# Patient Record
Sex: Female | Born: 1990 | Hispanic: Yes | Marital: Single | Smoking: Never smoker
Health system: Southern US, Community
[De-identification: ages and names within clinical notes are randomized; demographics above are authoritative.]

## PROBLEM LIST (undated history)

## (undated) ENCOUNTER — Inpatient Hospital Stay (HOSPITAL_COMMUNITY): Payer: Self-pay

## (undated) HISTORY — PX: ABSCESS DRAINAGE: SHX1119

---

## 2009-10-30 ENCOUNTER — Emergency Department: Payer: Self-pay

## 2013-11-13 ENCOUNTER — Emergency Department (HOSPITAL_COMMUNITY)
Admission: EM | Admit: 2013-11-13 | Discharge: 2013-11-13 | Disposition: A | Discharge status: Home / Self Care | Payer: Medicaid Other | Attending: Emergency Medicine | Admitting: Emergency Medicine

## 2013-11-13 ENCOUNTER — Emergency Department (HOSPITAL_COMMUNITY): Payer: Medicaid Other

## 2013-11-13 ENCOUNTER — Encounter (HOSPITAL_COMMUNITY): Payer: Self-pay | Admitting: Emergency Medicine

## 2013-11-13 DIAGNOSIS — N939 Abnormal uterine and vaginal bleeding, unspecified: Secondary | ICD-10-CM

## 2013-11-13 DIAGNOSIS — O210 Mild hyperemesis gravidarum: Secondary | ICD-10-CM | POA: Insufficient documentation

## 2013-11-13 DIAGNOSIS — O200 Threatened abortion: Secondary | ICD-10-CM | POA: Insufficient documentation

## 2013-11-13 LAB — CBC
HCT: 40.1 % (ref 36.0–46.0)
Hemoglobin: 14.3 g/dL (ref 12.0–15.0)
MCH: 31.5 pg (ref 26.0–34.0)
MCHC: 35.7 g/dL (ref 30.0–36.0)
MCV: 88.3 fL (ref 78.0–100.0)
PLATELETS: 256 10*3/uL (ref 150–400)
RBC: 4.54 MIL/uL (ref 3.87–5.11)
RDW: 12.4 % (ref 11.5–15.5)
WBC: 11.4 10*3/uL — ABNORMAL HIGH (ref 4.0–10.5)

## 2013-11-13 LAB — WET PREP, GENITAL
Clue Cells Wet Prep HPF POC: NONE SEEN
Trich, Wet Prep: NONE SEEN
Yeast Wet Prep HPF POC: NONE SEEN

## 2013-11-13 LAB — PREGNANCY, URINE: PREG TEST UR: POSITIVE — AB

## 2013-11-13 LAB — ABO/RH: ABO/RH(D): O POS

## 2013-11-13 LAB — HCG, QUANTITATIVE, PREGNANCY: hCG, Beta Chain, Quant, S: 36046 m[IU]/mL — ABNORMAL HIGH (ref <5)

## 2013-11-13 NOTE — ED Notes (Signed)
PA assess for fetal heart tones- unable to hear any at this time. Pt states that at her most recent appointment they were unable to hear any heart tones as well.

## 2013-11-13 NOTE — ED Notes (Signed)
Pt knows to [facility] in the next couple of days. Pt is stable for discharge.

## 2013-11-13 NOTE — ED Provider Notes (Signed)
CSN: [id number]     Arrival date & time 11/13/13  1149 History   First MD Initiated Contact with Patient 11/13/13 1257     Chief Complaint  Patient presents with  . Vaginal Bleeding     (Consider location/radiation/quality/duration/timing/severity/associated sxs/prior Treatment) Patient is a 23 y.o. female presenting with vaginal bleeding.  Vaginal Bleeding  23 yo G2P1 female presents with vaginal bleeding and [redacted] weeks pregnant. Patient states she first noticed bleeding Saturday evening around 7pm. Patient reports bright red blood per vagina, the equivalent of a "light period" for her. Patient states she bled through her underwear and shorts. Reports going to restroom and feeling/hearing something fall from her vagina into the toilet but did not see anything upon inspection. Patient also admits to mild lower abdominal cramping that started today rated 1/10 and constant. States bleeding has diminished and reports no current bleeding as of today. Denies tobacco or alcohol use. Denies any medication use. Patient admits to N/V consistent with her typcial morning sickness. Denies any Diarrhea or bloody stools. Patient denies any other concerns at this time. Patient states she goes to health dept for prenatal care.  History reviewed. No pertinent past medical history. History reviewed. No pertinent past surgical history. No family history on file. History  Substance Use Topics  . Smoking status: Never Smoker   . Smokeless tobacco: Not on file  . Alcohol Use: No   OB History   Grav Para Term Preterm Abortions TAB SAB Ect Mult Living   1              Review of Systems  Genitourinary: Positive for vaginal bleeding.  All other systems reviewed and are negative.     Allergies  Review of patient's allergies indicates no known allergies.  Home Medications   Prior to Admission medications   Not on File   BP 89/54  Pulse 80  Temp(Src) 97.9 F (36.6 C) (Oral)  Resp 16  Wt 155 lb  (70.308 kg)  SpO2 99% Physical Exam  Nursing note and vitals reviewed. Constitutional: She is oriented to person, place, and time. She appears well-developed and well-nourished. No distress.  HENT:  Head: Normocephalic and atraumatic.  Neck: No JVD present. No tracheal deviation present.  Cardiovascular: Normal rate and regular rhythm.  Exam reveals no gallop and no friction rub.   No murmur heard. Pulmonary/Chest: Effort normal. No respiratory distress. She has no wheezes. She has no rhonchi. She has no rales.  Musculoskeletal: She exhibits no edema.  Neurological: She is alert and oriented to person, place, and time.  Skin: Skin is warm and dry. She is not diaphoretic.  Psychiatric: She has a normal mood and affect. Her behavior is normal.    ED Course  Procedures (including critical care time) Labs Review Labs Reviewed  WET PREP, GENITAL - Abnormal; Notable for the following:    WBC, Wet Prep HPF POC TOO NUMEROUS TO COUNT (*)    All other components within normal limits  PREGNANCY, URINE - Abnormal; Notable for the following:    Preg Test, Ur POSITIVE (*)    All other components within normal limits  CBC - Abnormal; Notable for the following:    WBC 11.4 (*)    All other components within normal limits  HCG, QUANTITATIVE, PREGNANCY - Abnormal; Notable for the following:    hCG, Beta Chain, Quant, S 08657 (*)    All other components within normal limits  GC/CHLAMYDIA PROBE AMP  URINALYSIS, ROUTINE W REFLEX  MICROSCOPIC  ABO/RH    Imaging Review [place] Ob Comp Less 14 Wks  11/13/2013   CLINICAL DATA:  Vaginal bleeding.  First trimester pregnancy.  EXAM: OBSTETRIC <14 WK US AND TRANSVAGINAL OB US  TECHNIQUE: Both transabdominal and transvaginal ultrasound examinations were performed for complete evaluation of the gestation as well as the maternal uterus, adnexal regions, and pelvic cul-de-sac. Transvaginal technique was performed to assess early pregnancy.  COMPARISON:  None.   FINDINGS: Intrauterine gestational sac: Visualized/normal in shape. Unfused amnion noted.  Yolk sac:  Visualized.  Embryo:  Visualized.  Cardiac Activity: Visualized.  Heart Rate:  197 bpm  CRL:   23.7  mm   9 w 1 d                  US EDC: 06/17/2014  Maternal uterus/adnexae: There is no evidence of subchorionic hematoma. Both maternal ovaries appear normal. There is no adnexal mass or free pelvic fluid.  IMPRESSION: Single live intrauterine pregnancy with best estimated gestational age of [redacted] weeks 1 day. No placental or adnexal abnormalities identified.   Electronically Signed   By: Roxy HorsemanBill  Veazey M.D.   On: 11/13/2013 15:36   [place] Ob Transvaginal  11/13/2013   CLINICAL DATA:  Vaginal bleeding.  First trimester pregnancy.  EXAM: OBSTETRIC <14 WK US AND TRANSVAGINAL OB US  TECHNIQUE: Both transabdominal and transvaginal ultrasound examinations were performed for complete evaluation of the gestation as well as the maternal uterus, adnexal regions, and pelvic cul-de-sac. Transvaginal technique was performed to assess early pregnancy.  COMPARISON:  None.  FINDINGS: Intrauterine gestational sac: Visualized/normal in shape. Unfused amnion noted.  Yolk sac:  Visualized.  Embryo:  Visualized.  Cardiac Activity: Visualized.  Heart Rate:  197 bpm  CRL:   23.7  mm   9 w 1 d                  US EDC: 06/17/2014  Maternal uterus/adnexae: There is no evidence of subchorionic hematoma. Both maternal ovaries appear normal. There is no adnexal mass or free pelvic fluid.  IMPRESSION: Single live intrauterine pregnancy with best estimated gestational age of [redacted] weeks 1 day. No placental or adnexal abnormalities identified.   Electronically Signed   By: Roxy HorsemanBill  Veazey M.D.   On: 11/13/2013 15:36     EKG Interpretation None      MDM   Final diagnoses:  Threatened abortion  Vaginal bleeding    Patient afebrile with normal VS on presentation.  Appears in NAD.  Patient with positive pregnancy  Benign abdominal exam, soft and  non tender.  Wet prep negative for yeast, trich, and clue cells. Too many WBCs to count on wet prep.  H& H appears stable. Pelvic exam reveals no active bleeding.  Blood type O+, no need for Rhogam at this time.  Mild leukocytosis, nonspecific, suspect secondary to pregnancy.  Quant HCG elevated to [address] Plan to get pelvic US. To assess for viable IUP.   Ultrasound shows single live IUP with EGA of [redacted] wks and 1 day, with no placental or adnexal abnormalities.    Discussed findings with patient.Explained risks of threatened abortion. Patient advised to abstain for lifting, heavy exertion, or sexual intercourse until able to follow up with OBGYN for further evaluation.   . Plan to have patient follow up with her OBGYN. [facility] for [facility] follow up if needed.   Return precautions given for any worsening sxs and advised to return immediately if  any worsening abdominal pain or vaginal bleeding.  Patient confirms understanding.  Discharged in good condition.       Rudene AndaJacob Gray Kealii Thueson, PA-C 11/14/13 1601

## 2013-11-13 NOTE — Discharge Instructions (Signed)
Follow up with [facility] in 2 days for further management of your pregnancy and vaginal bleeding. Return to Emergency department if you develop any worsening vaginal bleeding or abdominal pain.    Threatened Miscarriage  A threatened miscarriage is a pregnancy that may end. It may be marked by bleeding during the first 20 weeks of pregnancy. Often, the pregnancy can continue without any more problems. You may be asked to stop:  Having sex (intercourse).  Having orgasms.  Using tampons.  Exercising.  Doing heavy physical activity and work. HOME CARE   Your doctor may tell you to take bed rest and to stop activities and work.  Write down the number of pads you use each day. Write down how often you change pads. Write down how soaked they are.  Follow your doctor's advice for follow-up visits and tests.  If your blood type is Rh-negative and the father's blood is Rh-positive (or is not known), you may get a shot to protect the baby.  If you have a miscarriage, save all the tissue you pass in a container. Take the container to your doctor. GET HELP RIGHT AWAY IF:   You have bad cramps or pain in your belly (abdomen), lower belly, or back.  You have a fever or chills.  Your bleeding gets worse or you pass large clots of blood or tissue. Save this tissue to show your doctor.  You feel lightheaded, weak, dizzy, or pass out (faint).  You have a gush of fluid from your vagina. MAKE SURE YOU:   Understand these instructions.  Will watch your condition.  Will get help right away if you are not doing well or get worse. Document Released: 06/25/2008 Document Revised: 10/05/2011 Document Reviewed: 07/29/2009 [facility] Patient Information 2014 [place], [place].

## 2013-11-13 NOTE — ED Notes (Addendum)
Pt reports [redacted] weeks pregnant, small amount vaginal bleeding since Saturday. Went to health department this morning, sent here for ultrasound. Pt is a x 4. In NAD. G2 P1.

## 2013-11-14 LAB — GC/CHLAMYDIA PROBE AMP
CT Probe RNA: NEGATIVE
GC Probe RNA: NEGATIVE

## 2013-11-17 NOTE — ED Provider Notes (Signed)
Medical screening examination/treatment/procedure(s) were performed by non-physician practitioner and as supervising physician I was immediately available for consultation/collaboration.   EKG Interpretation None       Brexley Cutshaw T Archibald Marchetta, MD 11/17/13 1420 

## 2014-05-28 ENCOUNTER — Encounter (HOSPITAL_COMMUNITY): Payer: Self-pay | Admitting: Emergency Medicine

## 2015-09-03 ENCOUNTER — Inpatient Hospital Stay (HOSPITAL_COMMUNITY): Payer: Medicaid Other

## 2015-09-03 ENCOUNTER — Encounter (HOSPITAL_COMMUNITY): Payer: Self-pay | Admitting: *Deleted

## 2015-09-03 ENCOUNTER — Inpatient Hospital Stay (HOSPITAL_COMMUNITY)
Admission: AD | Admit: 2015-09-03 | Discharge: 2015-09-03 | Disposition: A | Discharge status: Home / Self Care | Payer: Medicaid Other | Source: Ambulatory Visit | Attending: Family Medicine | Admitting: Family Medicine

## 2015-09-03 DIAGNOSIS — R109 Unspecified abdominal pain: Secondary | ICD-10-CM | POA: Insufficient documentation

## 2015-09-03 DIAGNOSIS — O26899 Other specified pregnancy related conditions, unspecified trimester: Secondary | ICD-10-CM

## 2015-09-03 DIAGNOSIS — O209 Hemorrhage in early pregnancy, unspecified: Secondary | ICD-10-CM | POA: Diagnosis not present

## 2015-09-03 DIAGNOSIS — O4691 Antepartum hemorrhage, unspecified, first trimester: Secondary | ICD-10-CM | POA: Insufficient documentation

## 2015-09-03 DIAGNOSIS — O9989 Other specified diseases and conditions complicating pregnancy, childbirth and the puerperium: Secondary | ICD-10-CM | POA: Diagnosis not present

## 2015-09-03 DIAGNOSIS — Z3A11 11 weeks gestation of pregnancy: Secondary | ICD-10-CM | POA: Diagnosis not present

## 2015-09-03 LAB — URINALYSIS, ROUTINE W REFLEX MICROSCOPIC
Bilirubin Urine: NEGATIVE
Glucose, UA: NEGATIVE mg/dL
Ketones, ur: NEGATIVE mg/dL
Nitrite: NEGATIVE
Protein, ur: NEGATIVE mg/dL
Specific Gravity, Urine: 1.025 (ref 1.005–1.030)
pH: 5.5 (ref 5.0–8.0)

## 2015-09-03 LAB — CBC
HEMATOCRIT: 38 % (ref 36.0–46.0)
Hemoglobin: 13.6 g/dL (ref 12.0–15.0)
MCH: 31.1 pg (ref 26.0–34.0)
MCHC: 35.8 g/dL (ref 30.0–36.0)
MCV: 87 fL (ref 78.0–100.0)
Platelets: 231 10*3/uL (ref 150–400)
RBC: 4.37 MIL/uL (ref 3.87–5.11)
RDW: 13.5 % (ref 11.5–15.5)
WBC: 11.3 10*3/uL — ABNORMAL HIGH (ref 4.0–10.5)

## 2015-09-03 LAB — URINE MICROSCOPIC-ADD ON

## 2015-09-03 LAB — WET PREP, GENITAL
Clue Cells Wet Prep HPF POC: NONE SEEN
Sperm: NONE SEEN
Trich, Wet Prep: NONE SEEN
YEAST WET PREP: NONE SEEN

## 2015-09-03 LAB — POCT PREGNANCY, URINE: Preg Test, Ur: POSITIVE — AB

## 2015-09-03 LAB — ABO/RH: ABO/RH(D): O POS

## 2015-09-03 LAB — HCG, QUANTITATIVE, PREGNANCY: hCG, Beta Chain, Quant, S: 42043 m[IU]/mL — ABNORMAL HIGH (ref <5)

## 2015-09-03 NOTE — MAU Note (Signed)
Has been cramping and bleeding since last night.  Like a light period, small clots.  Ongoing nausea and vomiting

## 2015-09-03 NOTE — MAU Provider Note (Signed)
Chief Complaint: Abdominal Cramping and Vaginal Bleeding   First Provider Initiated Contact with Patient 09/03/15 1417        SUBJECTIVE  HPI Suzanne Shelton is a 25 y.o. G3P0 at [redacted]w[redacted]d by LMP who presents to maternity admissions reporting bleeding and cramping since yesterday. States it is like a period, but cramping is light. No focal pain on the sides.. She denies vaginal itching/burning, urinary symptoms, h/a, dizziness, n/v, or fever/chills.    History is remarkable for placenta previa with first baby.   RN Note: Has been cramping and bleeding since last night. Like a light period, small clots. Ongoing nausea and vomiting         History reviewed. No pertinent past medical history. History reviewed. No pertinent past surgical history. Social History   Social History  . Marital Status: Single    Spouse Name: N/A  . Number of Children: N/A  . Years of Education: N/A   Occupational History  . Not on file.   Social History Main Topics  . Smoking status: Never Smoker   . Smokeless tobacco: Not on file  . Alcohol Use: No  . Drug Use: No  . Sexual Activity: Yes    Birth Control/ Protection: None   Other Topics Concern  . Not on file   Social History Narrative   No current facility-administered medications on file prior to encounter.   Current Outpatient Prescriptions on File Prior to Encounter  Medication Sig Dispense Refill  . Prenatal Vit-Fe Sulfate-FA (PRENATAL VITAMIN PO) Take 1 tablet by mouth daily.     No Known Allergies  I have reviewed patient's Past Medical Hx, Surgical Hx, Family Hx, Social Hx, medications and allergies.   ROS:  Review of Systems  Constitutional: Negative for fever and chills.  Gastrointestinal: Negative for nausea, vomiting, abdominal pain, diarrhea and constipation.  Genitourinary: Negative for dysuria. Positive for bleeding and cramping Musculoskeletal: Negative for back pain.  Neurological: Negative for dizziness  and weakness.    Physical Exam  Patient Vitals for the past 24 hrs:  BP Temp Temp src Pulse Resp SpO2 Height Weight  09/03/15 1335 106/66 mmHg 97.4 F (36.3 C) Oral 98 18 100 % 5' 3.5" (1.613 m) 140 lb 9.6 oz (63.776 kg)    Physical Exam  Constitutional: Well-developed, well-nourished female in no acute distress.  Cardiovascular: normal rate Respiratory: normal effort GI: Abd soft, non-tender. Pos BS x 4 MS: Extremities nontender, no edema, normal ROM Neurologic: Alert and oriented x 4.  GU: Neg CVAT.  PELVIC EXAM: Cervix pink, visually closed, without lesion, small amount light red discharge, vaginal walls and external genitalia normal Bimanual exam: Cervix 0/long/high, firm, anterior, neg CMT, uterus nontender, about 10 wk size,     adnexa without tenderness, or mass   LAB RESULTS Results for orders placed or performed during the hospital encounter of 09/03/15 (from the past 24 hour(s))  Urinalysis, Routine w reflex microscopic (not at [facility])     Status: Abnormal   Collection Time: 09/03/15  1:40 PM  Result Value Ref Range   Color, Urine YELLOW YELLOW   APPearance CLEAR CLEAR   Specific Gravity, Urine 1.025 1.005 - 1.030   pH 5.5 5.0 - 8.0   Glucose, UA NEGATIVE NEGATIVE mg/dL   Hgb urine dipstick LARGE (A) NEGATIVE   Bilirubin Urine NEGATIVE NEGATIVE   Ketones, ur NEGATIVE NEGATIVE mg/dL   Protein, ur NEGATIVE NEGATIVE mg/dL   Nitrite NEGATIVE NEGATIVE   Leukocytes, UA TRACE (A) NEGATIVE  Urine microscopic-add on     Status: Abnormal   Collection Time: 09/03/15  1:40 PM  Result Value Ref Range   Squamous Epithelial / LPF 6-30 (A) NONE SEEN   WBC, UA 0-5 0 - 5 WBC/hpf   RBC / HPF 6-30 0 - 5 RBC/hpf   Bacteria, UA MANY (A) NONE SEEN   Urine-Other MUCOUS PRESENT   Pregnancy, urine POC     Status: Abnormal   Collection Time: 09/03/15  1:50 PM  Result Value Ref Range   Preg Test, Ur POSITIVE (A) NEGATIVE  CBC     Status: Abnormal   Collection Time: 09/03/15  2:30 PM   Result Value Ref Range   WBC 11.3 (H) 4.0 - 10.5 K/uL   RBC 4.37 3.87 - 5.11 MIL/uL   Hemoglobin 13.6 12.0 - 15.0 g/dL   HCT 16.1 09.6 - 04.5 %   MCV 87.0 78.0 - 100.0 fL   MCH 31.1 26.0 - 34.0 pg   MCHC 35.8 30.0 - 36.0 g/dL   RDW 40.9 81.1 - 91.4 %   Platelets 231 150 - 400 K/uL  hCG, quantitative, pregnancy     Status: Abnormal   Collection Time: 09/03/15  2:30 PM  Result Value Ref Range   hCG, Beta Chain, Quant, S 42043 (H) <5 mIU/mL  ABO/Rh     Status: None   Collection Time: 09/03/15  2:30 PM  Result Value Ref Range   ABO/RH(D) O POS   Wet prep, genital     Status: Abnormal   Collection Time: 09/03/15  2:30 PM  Result Value Ref Range   Yeast Wet Prep HPF POC NONE SEEN NONE SEEN   Trich, Wet Prep NONE SEEN NONE SEEN   Clue Cells Wet Prep HPF POC NONE SEEN NONE SEEN   WBC, Wet Prep HPF POC MANY (A) NONE SEEN   Sperm NONE SEEN        IMAGING US Ob Comp Less 14 Wks  09/03/2015  CLINICAL DATA:  Pelvic pain and cramping. Vaginal bleeding. Gestational age by LMP of 10 weeks 5 days. EXAM: OBSTETRIC <14 WK ULTRASOUND TECHNIQUE: Transabdominal ultrasound was performed for evaluation of the gestation as well as the maternal uterus and adnexal regions. COMPARISON:  None. FINDINGS: Intrauterine gestational sac: Visualized/normal in shape. Yolk sac:  Visualized Embryo:  Visualized Cardiac Activity: Visualized Heart Rate: 164 bpm CRL:   51  mm   11 w 6 d                  [place] EDC: 03/18/2016 Subchorionic hemorrhage: Small subchorionic hemorrhage seen in lower uterine segment. Maternal uterus/adnexae: Both ovaries are normal in appearance. No adnexal mass or free fluid identified. IMPRESSION: Single living IUP measuring 11 weeks 6 days with [place] EDC of 03/18/2016. Small subchorionic hemorrhage noted. Electronically Signed   By: Myles Rosenthal M.D.   On: 09/03/2015 15:33    MAU Management/MDM: Ordered labs and reviewed results.     Treatments in MAU included none.   This bleeding could have  represented a normal pregnancy with bleeding, spontaneous abortion or even an ectopic which can be life-threatening.   Cultures were done to rule out pelvic infection Blood drawn for Quant HCG, CBC, ABO/Rh  Pt stable at time of discharge.  ASSESSMENT SIUP at [redacted]w[redacted]d, live IUP Bleeding in early pregnancy Abdominal pain in pregnancy Small subchorionic hemorrhage  PLAN Discharge home Discussed results and [facility] Bleeding precautions Encouraged to seek early prenatal care    Medication List  ASK your doctor about these medications        PRENATAL VITAMIN PO  Take 1 tablet by mouth daily.         Wynelle Bourgeois CNM, MSN Certified Nurse-Midwife 09/03/2015  2:17 PM

## 2015-09-03 NOTE — Discharge Instructions (Signed)
Vaginal Bleeding During Pregnancy, First Trimester A small amount of bleeding (spotting) from the vagina is relatively common in early pregnancy. It usually stops on its own. Various things may cause bleeding or spotting in early pregnancy. Some bleeding may be related to the pregnancy, and some may not. In most cases, the bleeding is normal and is not a problem. However, bleeding can also be a sign of something serious. Be sure to tell your health care provider about any vaginal bleeding right away. Some possible causes of vaginal bleeding during the first trimester include:  Infection or inflammation of the cervix.  Growths (polyps) on the cervix.  Miscarriage or threatened miscarriage.  Pregnancy tissue has developed outside of the uterus and in a fallopian tube (tubal pregnancy).  Tiny cysts have developed in the uterus instead of pregnancy tissue (molar pregnancy). HOME CARE INSTRUCTIONS  Watch your condition for any changes. The following actions may help to lessen any discomfort you are feeling:  Follow your health care provider's instructions for limiting your activity. If your health care provider orders bed rest, you may need to stay in bed and only get up to use the bathroom. However, your health care provider may allow you to continue light activity.  If needed, make plans for someone to help with your regular activities and responsibilities while you are on bed rest.  Keep track of the number of pads you use each day, how often you change pads, and how soaked (saturated) they are. Write this down.  Do not use tampons. Do not douche.  Do not have sexual intercourse or orgasms until approved by your health care provider.  If you pass any tissue from your vagina, save the tissue so you can show it to your health care provider.  Only take over-the-counter or prescription medicines as directed by your health care provider.  Do not take aspirin because it can make you  bleed.  Keep all follow-up appointments as directed by your health care provider. SEEK MEDICAL CARE IF:  You have any vaginal bleeding during any part of your pregnancy.  You have cramps or labor pains.  You have a fever, not controlled by medicine. SEEK IMMEDIATE MEDICAL CARE IF:   You have severe cramps in your back or belly (abdomen).  You pass large clots or tissue from your vagina.  Your bleeding increases.  You feel light-headed or weak, or you have fainting episodes.  You have chills.  You are leaking fluid or have a gush of fluid from your vagina.  You pass out while having a bowel movement. MAKE SURE YOU:  Understand these instructions.  Will watch your condition.  Will get help right away if you are not doing well or get worse.   This information is not intended to replace advice given to you by your health care provider. Make sure you discuss any questions you have with your health care provider.   Document Released: 04/22/2005 Document Revised: 07/18/2013 Document Reviewed: 03/20/2013 Elsevier Interactive Patient Education [employer] First Trimester of Pregnancy The first trimester of pregnancy is from week 1 until the end of week 12 (months 1 through 3). A week after a sperm fertilizes an egg, the egg will implant on the wall of the uterus. This embryo will begin to develop into a baby. Genes from you and your partner are forming the baby. The female genes determine whether the baby is a boy or a girl. At 6-8 weeks, the eyes and face are  face are formed, and the heartbeat can be seen on ultrasound. At the end of 12 weeks, all the baby's organs are formed.  °Now that you are pregnant, you will want to do everything you can to have a healthy baby. Two of the most important things are to get good prenatal care and to follow your health care provider's instructions. Prenatal care is all the medical care you receive before the baby's birth. This care will help prevent,  find, and treat any problems during the pregnancy and childbirth. °BODY CHANGES °Your body goes through many changes during pregnancy. The changes vary from woman to woman.  °· You may gain or lose a couple of pounds at first. °· You may feel sick to your stomach (nauseous) and throw up (vomit). If the vomiting is uncontrollable, call your health care provider. °· You may tire easily. °· You may develop headaches that can be relieved by medicines approved by your health care provider. °· You may urinate more often. Painful urination may mean you have a bladder infection. °· You may develop heartburn as a result of your pregnancy. °· You may develop constipation because certain hormones are causing the muscles that push waste through your intestines to slow down. °· You may develop hemorrhoids or swollen, bulging veins (varicose veins). °· Your breasts may begin to grow larger and become tender. Your nipples may stick out more, and the tissue that surrounds them (areola) may become darker. °· Your gums may bleed and may be sensitive to brushing and flossing. °· Dark spots or blotches (chloasma, mask of pregnancy) may develop on your face. This will likely fade after the baby is born. °· Your menstrual periods will stop. °· You may have a loss of appetite. °· You may develop cravings for certain kinds of food. °· You may have changes in your emotions from day to day, such as being excited to be pregnant or being concerned that something may go wrong with the pregnancy and baby. °· You may have more vivid and strange dreams. °· You may have changes in your hair. These can include thickening of your hair, rapid growth, and changes in texture. Some women also have hair loss during or after pregnancy, or hair that feels dry or thin. Your hair will most likely return to normal after your baby is born. °WHAT TO EXPECT AT YOUR PRENATAL VISITS °During a routine prenatal visit: °· You will be weighed to make sure you and the  baby are growing normally. °· Your blood pressure will be taken. °· Your abdomen will be measured to track your baby's growth. °· The fetal heartbeat will be listened to starting around week 10 or 12 of your pregnancy. °· Test results from any previous visits will be discussed. °Your health care provider may ask you: °· How you are feeling. °· If you are feeling the baby move. °· If you have had any abnormal symptoms, such as leaking fluid, bleeding, severe headaches, or abdominal cramping. °· If you are using any tobacco products, including cigarettes, chewing tobacco, and electronic cigarettes. °· If you have any questions. °Other tests that may be performed during your first trimester include: °· Blood tests to find your blood type and to check for the presence of any previous infections. They will also be used to check for low iron levels (anemia) and Rh antibodies. Later in the pregnancy, blood tests for diabetes will be done along with other tests if problems develop. °· Urine tests   for infections, diabetes, or protein in the urine.  An ultrasound to confirm the proper growth and development of the baby.  An amniocentesis to check for possible genetic problems.  Fetal screens for spina bifida and Down syndrome.  You may need other tests to make sure you and the baby are doing well.  HIV (human immunodeficiency virus) testing. Routine prenatal testing includes screening for HIV, unless you choose not to have this test. HOME CARE INSTRUCTIONS  Medicines  Follow your health care provider's instructions regarding medicine use. Specific medicines may be either safe or unsafe to take during pregnancy.  Take your prenatal vitamins as directed.  If you develop constipation, try taking a stool softener if your health care provider approves. Diet  Eat regular, well-balanced meals. Choose a variety of foods, such as meat or vegetable-based protein, fish, milk and low-fat dairy products,  vegetables, fruits, and whole grain breads and cereals. Your health care provider will help you determine the amount of weight gain that is right for you.  Avoid raw meat and uncooked cheese. These carry germs that can cause birth defects in the baby.  Eating four or five small meals rather than three large meals a day may help relieve nausea and vomiting. If you start to feel nauseous, eating a few soda crackers can be helpful. Drinking liquids between meals instead of during meals also seems to help nausea and vomiting.  If you develop constipation, eat more high-fiber foods, such as fresh vegetables or fruit and whole grains. Drink enough fluids to keep your urine clear or pale yellow. Activity and Exercise  Exercise only as directed by your health care provider. Exercising will help you:  Control your weight.  Stay in shape.  Be prepared for labor and delivery.  Experiencing pain or cramping in the lower abdomen or low back is a good sign that you should stop exercising. Check with your health care provider before continuing normal exercises.  Try to avoid standing for long periods of time. Move your legs often if you must stand in one place for a long time.  Avoid heavy lifting.  Wear low-heeled shoes, and practice good posture.  You may continue to have sex unless your health care provider directs you otherwise. Relief of Pain or Discomfort  Wear a good support bra for breast tenderness.   Take warm sitz baths to soothe any pain or discomfort caused by hemorrhoids. Use hemorrhoid cream if your health care provider approves.   Rest with your legs elevated if you have leg cramps or low back pain.  If you develop varicose veins in your legs, wear support hose. Elevate your feet for 15 minutes, 3-4 times a day. Limit salt in your diet. Prenatal Care  Schedule your prenatal visits by the twelfth week of pregnancy. They are usually scheduled monthly at first, then more often in  the last 2 months before delivery.  Write down your questions. Take them to your prenatal visits.  Keep all your prenatal visits as directed by your health care provider. Safety  Wear your seat belt at all times when driving.  Make a list of emergency phone numbers, including numbers for family, friends, the hospital, and police and fire departments. General Tips  Ask your health care provider for a referral to a local prenatal education class. Begin classes no later than at the beginning of month 6 of your pregnancy.  Ask for help if you have counseling or nutritional needs during pregnancy. Your health  care provider can offer advice or refer you to specialists for help with various needs.  Do not use hot tubs, steam rooms, or saunas.  Do not douche or use tampons or scented sanitary pads.  Do not cross your legs for long periods of time.  Avoid cat litter boxes and soil used by cats. These carry germs that can cause birth defects in the baby and possibly loss of the fetus by miscarriage or stillbirth.  Avoid all smoking, herbs, alcohol, and medicines not prescribed by your health care provider. Chemicals in these affect the formation and growth of the baby.  Do not use any tobacco products, including cigarettes, chewing tobacco, and electronic cigarettes. If you need help quitting, ask your health care provider. You may receive counseling support and other resources to help you quit.  Schedule a dentist appointment. At home, brush your teeth with a soft toothbrush and be gentle when you floss. SEEK MEDICAL CARE IF:   You have dizziness.  You have mild pelvic cramps, pelvic pressure, or nagging pain in the abdominal area.  You have persistent nausea, vomiting, or diarrhea.  You have a bad smelling vaginal discharge.  You have pain with urination.  You notice increased swelling in your face, hands, legs, or ankles. SEEK IMMEDIATE MEDICAL CARE IF:   You have a fever.  You  are leaking fluid from your vagina.  You have spotting or bleeding from your vagina.  You have severe abdominal cramping or pain.  You have rapid weight gain or loss.  You vomit blood or material that looks like coffee grounds.  You are exposed to [place] measles and have never had them.  You are exposed to fifth disease or chickenpox.  You develop a severe headache.  You have shortness of breath.  You have any kind of trauma, such as from a fall or a car accident.   This information is not intended to replace advice given to you by your health care provider. Make sure you discuss any questions you have with your health care provider.   Document Released: 07/07/2001 Document Revised: 08/03/2014 Document Reviewed: 05/23/2013 Elsevier Interactive Patient Education 2016 [employer]. Prenatal Care Providers [facility]    [facility]  [phone]     Phone: [phone]          [facility]                      [facility]  @[place]  [place]     Phone: [phone]  Phone: [phone]         [facility]     Phone: [phone]  Phone: [phone]           [facility] @ [place]                hone: [phone]  Phone: [phone]         [facility] Dr[address] Francoise Ceo      Phone: [phone]  Phone: [phone]         [facility]                Phone: [phone]  [facility]   [address] [place])          Phone: [phone] [facility]   Phone: [phone]

## 2015-09-04 LAB — HIV ANTIBODY (ROUTINE TESTING W REFLEX): HIV SCREEN 4TH GENERATION: NONREACTIVE

## 2015-09-04 LAB — GC/CHLAMYDIA PROBE AMP (~~LOC~~) NOT AT ARMC
CHLAMYDIA, DNA PROBE: NEGATIVE
NEISSERIA GONORRHEA: NEGATIVE

## 2015-11-20 ENCOUNTER — Other Ambulatory Visit (HOSPITAL_COMMUNITY): Payer: Self-pay | Admitting: Family Medicine

## 2015-11-20 ENCOUNTER — Encounter (HOSPITAL_COMMUNITY): Payer: Self-pay | Admitting: Family Medicine

## 2015-11-20 DIAGNOSIS — Z3A25 25 weeks gestation of pregnancy: Secondary | ICD-10-CM

## 2015-11-20 DIAGNOSIS — Z3689 Encounter for other specified antenatal screening: Secondary | ICD-10-CM

## 2015-11-22 ENCOUNTER — Encounter (HOSPITAL_COMMUNITY): Payer: Self-pay | Admitting: Family Medicine

## 2015-12-04 ENCOUNTER — Encounter (HOSPITAL_COMMUNITY): Payer: Self-pay

## 2015-12-04 ENCOUNTER — Ambulatory Visit (HOSPITAL_COMMUNITY)
Admission: RE | Admit: 2015-12-04 | Discharge: 2015-12-04 | Disposition: A | Discharge status: Home / Self Care | Payer: Medicaid Other | Source: Ambulatory Visit | Attending: Family Medicine | Admitting: Family Medicine

## 2015-12-04 VITALS — BP 102/65 | HR 94 | Wt 155.0 lb

## 2015-12-04 DIAGNOSIS — Z3A25 25 weeks gestation of pregnancy: Secondary | ICD-10-CM | POA: Diagnosis not present

## 2015-12-04 DIAGNOSIS — Z36 Encounter for antenatal screening of mother: Secondary | ICD-10-CM | POA: Diagnosis not present

## 2015-12-04 DIAGNOSIS — O358XX0 Maternal care for other (suspected) fetal abnormality and damage, not applicable or unspecified: Secondary | ICD-10-CM | POA: Insufficient documentation

## 2015-12-04 DIAGNOSIS — Z3689 Encounter for other specified antenatal screening: Secondary | ICD-10-CM

## 2015-12-04 DIAGNOSIS — O35EXX0 Maternal care for other (suspected) fetal abnormality and damage, fetal genitourinary anomalies, not applicable or unspecified: Secondary | ICD-10-CM

## 2015-12-11 ENCOUNTER — Other Ambulatory Visit (HOSPITAL_COMMUNITY): Payer: Self-pay

## 2016-01-15 ENCOUNTER — Ambulatory Visit (HOSPITAL_COMMUNITY)
Admission: RE | Admit: 2016-01-15 | Discharge: 2016-01-15 | Disposition: A | Discharge status: Home / Self Care | Payer: Medicaid Other | Source: Ambulatory Visit | Attending: Family Medicine | Admitting: Family Medicine

## 2016-01-15 ENCOUNTER — Other Ambulatory Visit (HOSPITAL_COMMUNITY): Payer: Self-pay | Admitting: Maternal and Fetal Medicine

## 2016-01-15 ENCOUNTER — Encounter (HOSPITAL_COMMUNITY): Payer: Self-pay

## 2016-01-15 DIAGNOSIS — O358XX0 Maternal care for other (suspected) fetal abnormality and damage, not applicable or unspecified: Secondary | ICD-10-CM

## 2016-01-15 DIAGNOSIS — O35EXX0 Maternal care for other (suspected) fetal abnormality and damage, fetal genitourinary anomalies, not applicable or unspecified: Secondary | ICD-10-CM

## 2016-01-15 DIAGNOSIS — O359XX0 Maternal care for (suspected) fetal abnormality and damage, unspecified, not applicable or unspecified: Secondary | ICD-10-CM | POA: Insufficient documentation

## 2016-01-15 DIAGNOSIS — Z3A31 31 weeks gestation of pregnancy: Secondary | ICD-10-CM | POA: Insufficient documentation

## 2016-01-15 DIAGNOSIS — Z3A36 36 weeks gestation of pregnancy: Secondary | ICD-10-CM

## 2016-02-12 ENCOUNTER — Ambulatory Visit (HOSPITAL_COMMUNITY): Payer: Medicaid Other

## 2016-02-18 ENCOUNTER — Ambulatory Visit (HOSPITAL_COMMUNITY)
Admission: RE | Admit: 2016-02-18 | Discharge: 2016-02-18 | Disposition: A | Discharge status: Home / Self Care | Payer: Medicaid Other | Source: Ambulatory Visit | Attending: Family Medicine | Admitting: Family Medicine

## 2016-02-18 ENCOUNTER — Encounter (HOSPITAL_COMMUNITY): Payer: Self-pay

## 2016-02-18 DIAGNOSIS — Z3A35 35 weeks gestation of pregnancy: Secondary | ICD-10-CM | POA: Diagnosis not present

## 2016-02-18 DIAGNOSIS — Z36 Encounter for antenatal screening of mother: Secondary | ICD-10-CM | POA: Diagnosis present

## 2016-02-18 DIAGNOSIS — O359XX0 Maternal care for (suspected) fetal abnormality and damage, unspecified, not applicable or unspecified: Secondary | ICD-10-CM | POA: Diagnosis present

## 2016-02-18 DIAGNOSIS — Z3A36 36 weeks gestation of pregnancy: Secondary | ICD-10-CM

## 2016-02-19 ENCOUNTER — Ambulatory Visit (HOSPITAL_COMMUNITY): Payer: Medicaid Other

## 2016-07-08 ENCOUNTER — Encounter (HOSPITAL_COMMUNITY): Payer: Self-pay

## 2016-08-06 IMAGING — US US MFM OB FOLLOW-UP
1 series · 13 of 28 positions shown · non-contrast
Comparison: none

[Series 1: us mfm ob follow-up · 13 of 85 slices shown]
[im 4/85]
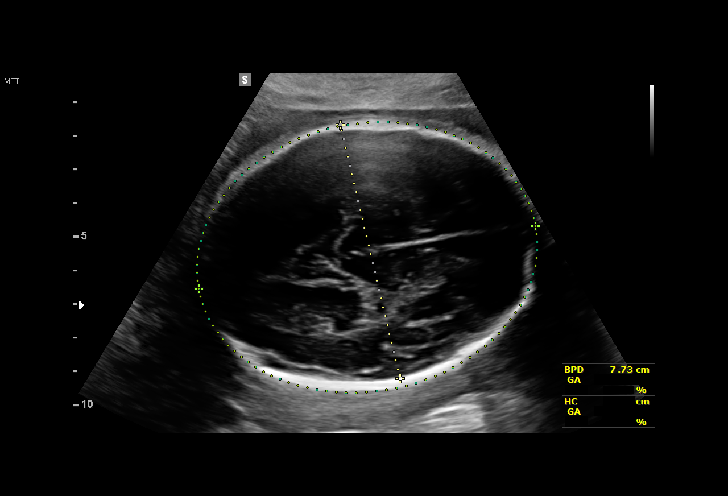
[im 10/85]
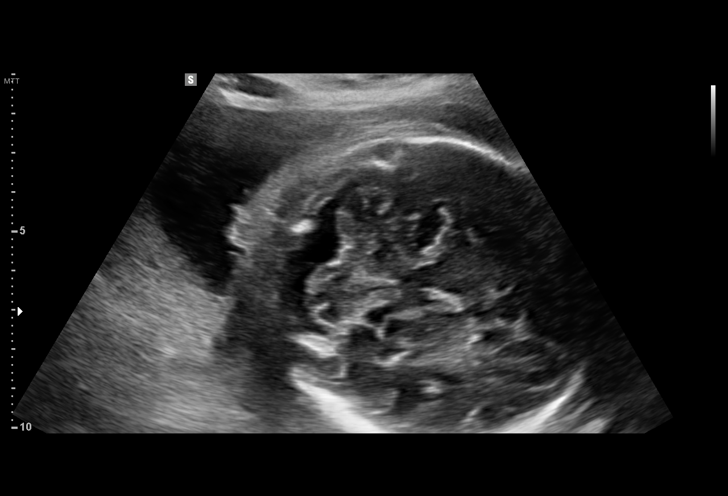
[im 16/85]
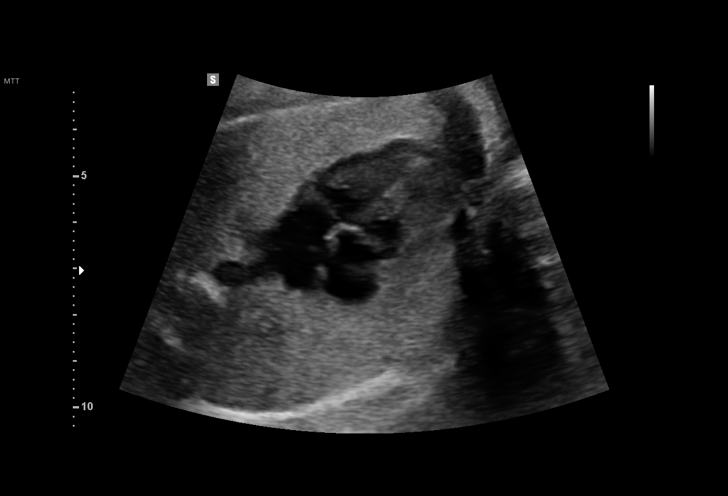
[im 22/85]
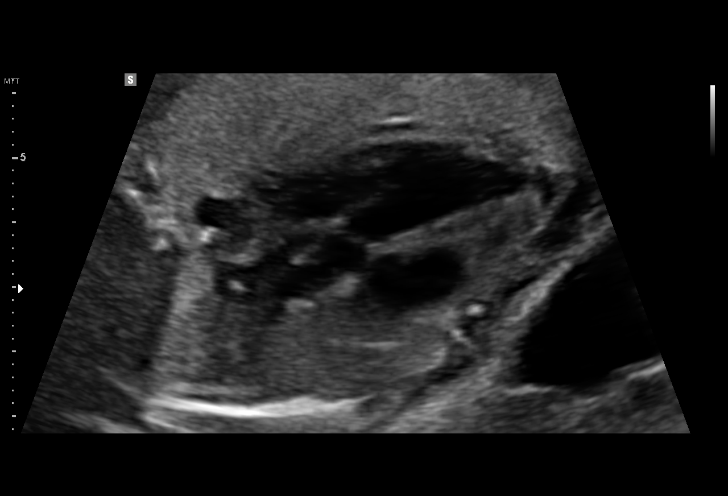
[im 29/85]
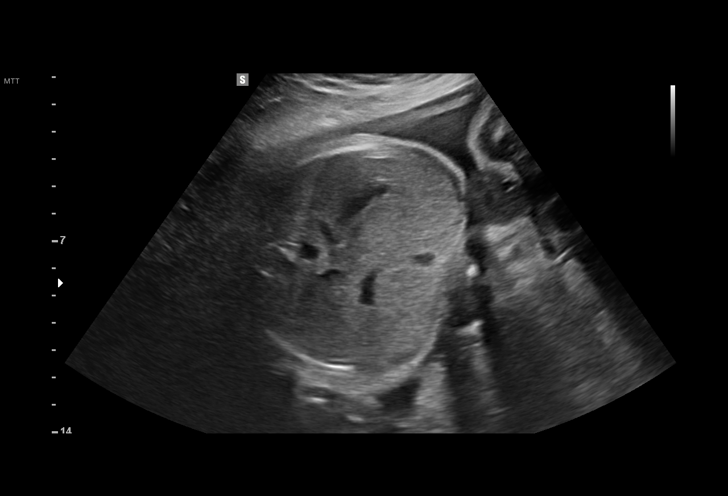
[im 35/85]
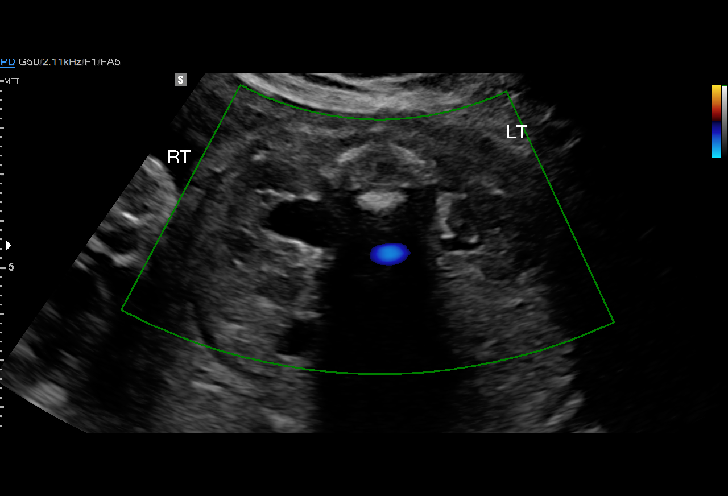
[im 44/85]
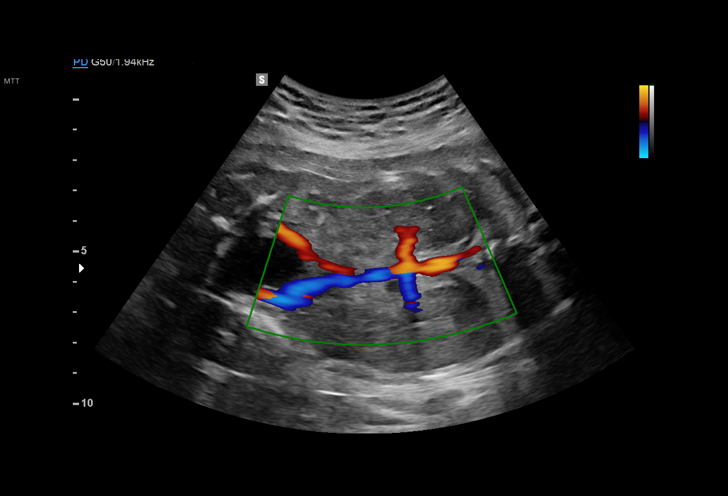
[im 50/85]
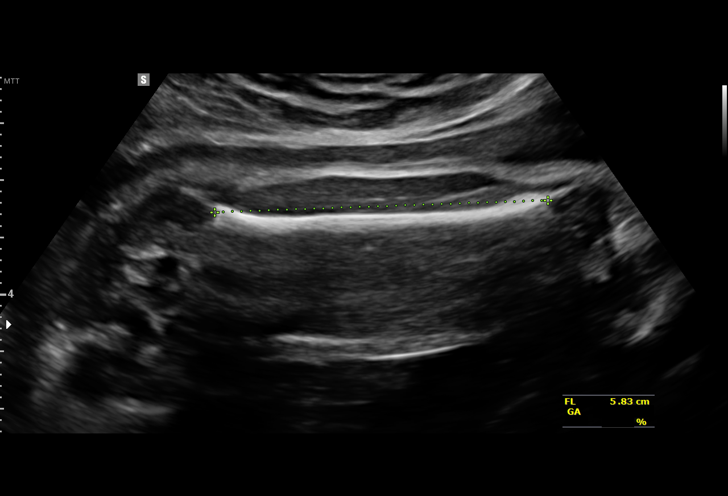
[im 57/85]
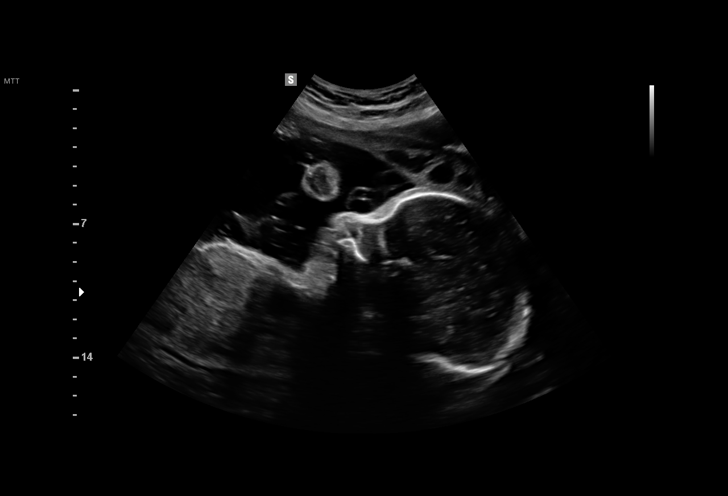
[im 63/85]
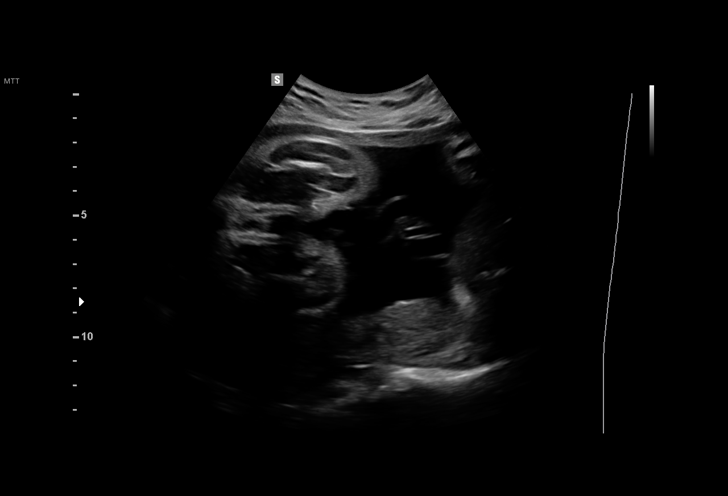
[im 69/85]
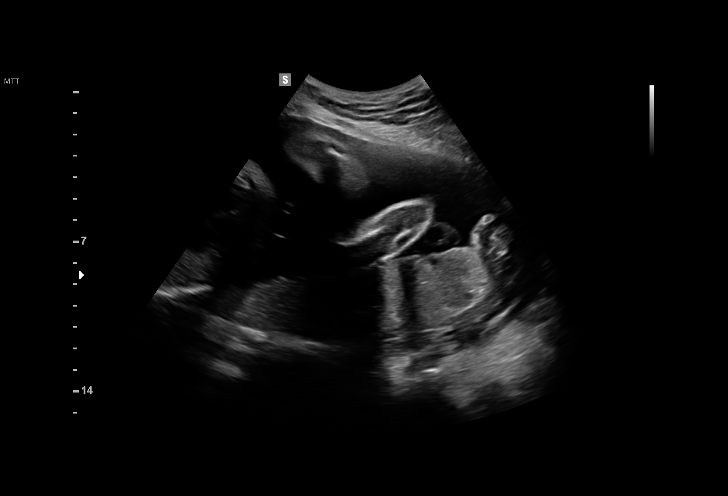
[im 75/85]
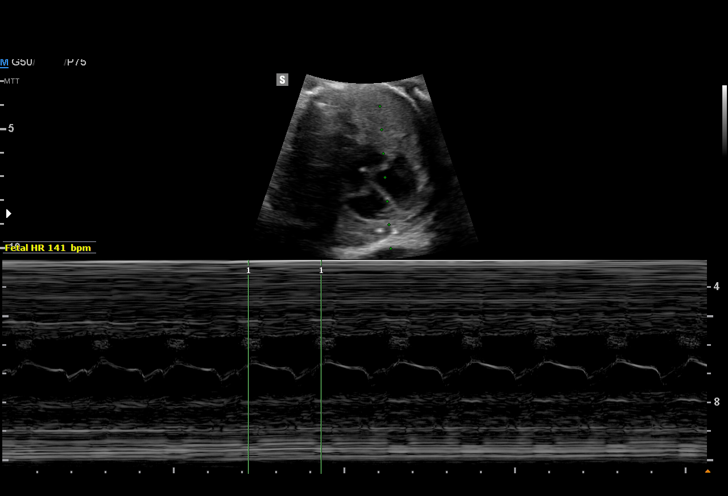
[im 81/85]
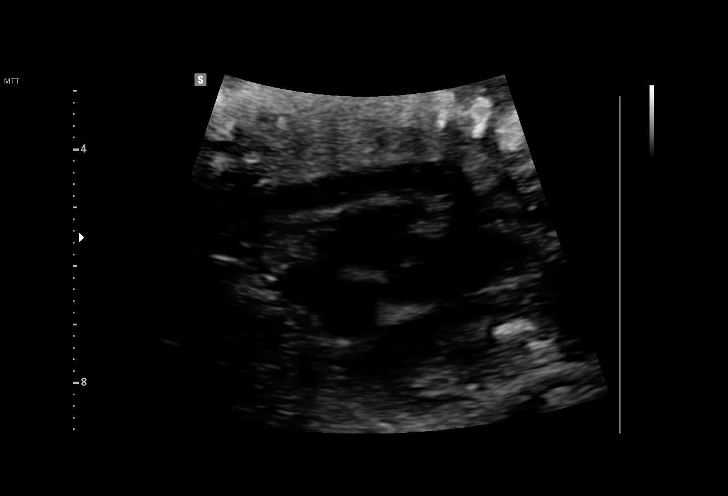

[13 of 28 positions shown; findings below may reference images not displayed]

Health Department
[address], [address]  [HOSPITAL],
Ref. Address:     [place] [REDACTED]
[address], [address]  [HOSPITAL],

1  SAYED GHAFOOR KAAKAR            [id number]      [phone]     [phone]
Indications

31 weeks gestation of pregnancy
Fetal abnormality - other known or
suspected (renal pyelectasis)
OB History

Gravidity:    2         Term:   1
Living:       1
Fetal Evaluation

Num Of Fetuses:     1
Fetal Heart         141
Rate(bpm):
Cardiac Activity:   Observed
Presentation:       Cephalic
Placenta:           Posterior Fundal, above cervical os
P. Cord Insertion:  Previously Visualized

Amniotic Fluid
AFI FV:      Subjectively within normal limits

AFI Sum(cm)     %Tile       Largest Pocket(cm)
14.81           52
RUQ(cm)       RLQ(cm)       LUQ(cm)        LLQ(cm)
5.22
Biometry

BPD:      77.1  mm     G. Age:  31w 0d         37  %    CI:         72.9   %   70 - 86
FL/HC:      20.3   %   19.3 -
HC:      287.1  mm     G. Age:  31w 4d         29  %    HC/AC:      1.04       0.96 -
AC:      276.8  mm     G. Age:  31w 5d         68  %    FL/BPD:     75.6   %   71 - 87
FL:       58.3  mm     G. Age:  30w 3d         23  %    FL/AC:      21.1   %   20 - 24
HUM:        50  mm     G. Age:  29w 2d         16  %
CER:      38.2  mm     G. Age:  32w 4d         75  %
CM:        9.2  mm

Est. FW:    7328  gm    3 lb 13 oz      60  %
Gestational Age

LMP:           31w 0d       Date:   06/12/15                 EDD:   03/18/16
U/S Today:     31w 1d                                        EDD:   03/17/16
Best:          31w 0d    Det. By:   LMP  (06/12/15)          EDD:   03/18/16
Anatomy

Cranium:               Appears normal         Aortic Arch:            Appears normal
Cavum:                 Appears normal         Ductal Arch:            Previously seen
Ventricles:            Appears normal         Diaphragm:              Appears normal
Choroid Plexus:        Previously seen        Stomach:                Appears normal, left
sided
Cerebellum:            Appears normal         Abdomen:                Previously seen
Posterior Fossa:       Previously seen        Abdominal Wall:         Previously seen
Nuchal Fold:           Not applicable (>20    Cord Vessels:           Previously seen
wks GA)
Face:                  Orbits and profile     Kidneys:                Right sided
previously seen
pyelectasis, 10 mm
Lips:                  Previously seen        Bladder:                Appears normal
Thoracic:              Previously seen        Spine:                  Previously seen
Heart:                 Appears normal         Upper Extremities:      Previously seen
(4CH, axis, and
situs)
RVOT:                  Appears normal         Lower Extremities:      Previously seen
LVOT:                  Appears normal

Other:  Male gender previously seen. Heels and 5th digit previously seen.
Cervix Uterus Adnexa

Cervix
Not visualized (advanced GA >35wks)

Uterus
No abnormality visualized.

Left Ovary
Size(cm)     2.74  x    2.05   x  2.22      Vol(ml):
Within normal limits. No adnexal mass visualized.

Right Ovary
Size(cm)     2.75  x    2.69   x  1.67      Vol(ml):
Within normal limits. No adnexal mass visualized.
Cul De Sac:   No free fluid seen.
Adnexa:       No abnormality visualized.
Impression

Single IUP at 31w 0d
Right sided urinary tract dilation is again noted.  The right
renal pelvis measures 10 mm.  Some central calyceal dilation
is appreciated. (UTD A2-3)- overall stable if not slightly
improved since last evaluation
The left kidney appears normal
Fetal growth is appropriate (60th %tile)
Normal amniotic fluid volume
Recommendations

Recommend follow-up ultrasound examination in 4 weeks to
reevaluate the fetal kidneys

## 2018-01-08 ENCOUNTER — Encounter (HOSPITAL_COMMUNITY): Payer: Self-pay

## 2018-01-08 ENCOUNTER — Emergency Department (HOSPITAL_COMMUNITY)
Admission: EM | Admit: 2018-01-08 | Discharge: 2018-01-08 | Disposition: A | Discharge status: Home / Self Care | Payer: Self-pay | Attending: Emergency Medicine | Admitting: Emergency Medicine

## 2018-01-08 ENCOUNTER — Other Ambulatory Visit: Payer: Self-pay

## 2018-01-08 DIAGNOSIS — Z79899 Other long term (current) drug therapy: Secondary | ICD-10-CM | POA: Insufficient documentation

## 2018-01-08 DIAGNOSIS — R112 Nausea with vomiting, unspecified: Secondary | ICD-10-CM | POA: Insufficient documentation

## 2018-01-08 DIAGNOSIS — R51 Headache: Secondary | ICD-10-CM | POA: Insufficient documentation

## 2018-01-08 DIAGNOSIS — R519 Headache, unspecified: Secondary | ICD-10-CM

## 2018-01-08 MED ORDER — SODIUM CHLORIDE 0.9 % IV BOLUS
1000.0000 mL | Freq: Once | INTRAVENOUS | Status: AC
  Administered 2018-01-08: 1000 mL via INTRAVENOUS

## 2018-01-08 MED ORDER — KETOROLAC TROMETHAMINE 30 MG/ML IJ SOLN
30.0000 mg | Freq: Once | INTRAMUSCULAR | Status: AC
  Administered 2018-01-08: 30 mg via INTRAVENOUS
  Filled 2018-01-08: qty 1

## 2018-01-08 MED ORDER — BUTALBITAL-APAP-CAFF-COD 50-325-40-30 MG PO CAPS: ORAL_CAPSULE | ORAL | 0 refills | Status: DC

## 2018-01-08 MED ORDER — PROCHLORPERAZINE EDISYLATE 10 MG/2ML IJ SOLN
5.0000 mg | Freq: Once | INTRAMUSCULAR | Status: AC
  Administered 2018-01-08: 5 mg via INTRAVENOUS
  Filled 2018-01-08: qty 2

## 2018-01-08 MED ORDER — DIPHENHYDRAMINE HCL 50 MG/ML IJ SOLN
25.0000 mg | Freq: Once | INTRAMUSCULAR | Status: AC
  Administered 2018-01-08: 25 mg via INTRAVENOUS
  Filled 2018-01-08: qty 1

## 2018-01-08 NOTE — ED Notes (Signed)
EDP informed of pt's BP-instructed to have pt drink fluids and recheck bp in 20-30 min

## 2018-01-08 NOTE — ED Notes (Signed)
Pt resting quietly on stretcher .  No distress  

## 2018-01-08 NOTE — Discharge Instructions (Addendum)
Please call Dr Neale BurlyFreeman, or see a member of his team concerning management of your headaches. Use Fioricet every 6 hours for pain not improved by tylenol , and/or ibuprofen. This medication may cause drowsiness, please use with caution.

## 2018-01-08 NOTE — ED Provider Notes (Signed)
[facility] EMERGENCY DEPARTMENT Provider Note   CSN: [id number] Arrival date & time: 01/08/18  4098     History   Chief Complaint Chief Complaint  Patient presents with  . Headache    HPI Suzanne Shelton is a 27 y.o. female.   Headache   This is a recurrent problem. The current episode started more than 1 week ago. The problem occurs constantly. The problem has been gradually worsening. The headache is associated with nothing. The pain is located in the temporal region. The quality of the pain is described as throbbing. The pain is at a severity of 10/10. The pain does not radiate. Associated symptoms include nausea and vomiting. Pertinent negatives include no fever, no near-syncope, no palpitations, no syncope and no shortness of breath. She has tried acetaminophen and aspirin for the symptoms. The treatment provided no relief.    History reviewed. No pertinent past medical history.  There are no active problems to display for this patient.   History reviewed. No pertinent surgical history.   OB History    Gravida  3   Para  2   Term  2   Preterm      AB      Living  2     SAB      TAB      Ectopic      Multiple      Live Births               Home Medications    Prior to Admission medications   Medication Sig Start Date End Date Taking? Authorizing Provider  Prenatal Vit-Fe Sulfate-FA (PRENATAL VITAMIN PO) Take 1 tablet by mouth daily.    [provider]    Family History No family history on file.  Social History Social History   Tobacco Use  . Smoking status: Never Smoker  . Smokeless tobacco: Never Used  Substance Use Topics  . Alcohol use: No  . Drug use: No     Allergies   Patient has no known allergies.   Review of Systems Review of Systems  Constitutional: Negative for activity change and fever.       All ROS Neg except as noted in HPI  HENT: Negative for congestion, nosebleeds and sneezing.   Eyes:  Positive for photophobia. Negative for discharge.  Respiratory: Negative for cough, shortness of breath and wheezing.   Cardiovascular: Negative for chest pain, palpitations, syncope and near-syncope.  Gastrointestinal: Positive for nausea and vomiting. Negative for abdominal pain and blood in stool.  Genitourinary: Negative for dysuria, frequency and hematuria.  Musculoskeletal: Negative for arthralgias, back pain and neck pain.  Skin: Negative.   Neurological: Positive for headaches. Negative for dizziness, seizures and speech difficulty.  Psychiatric/Behavioral: Negative for confusion and hallucinations.     Physical Exam Updated Vital Signs BP 132/88 (BP Location: Left Arm)   Pulse (!) 107   Temp 98.2 F (36.8 C) (Oral)   Resp 18   Ht 5\' 4"  (1.626 m)   Wt 80.3 kg (177 lb)   SpO2 99%   BMI 30.38 kg/m   Physical Exam  Constitutional: She appears well-developed and well-nourished. No distress.  HENT:  Head: Normocephalic and atraumatic.  Right Ear: External ear normal.  Left Ear: External ear normal.  Eyes: Conjunctivae are normal. Right eye exhibits no discharge. Left eye exhibits no discharge. No scleral icterus.  Neck: Neck supple. No tracheal deviation present.  Cardiovascular: Normal rate, regular rhythm and intact  distal pulses.  Pulmonary/Chest: Effort normal and breath sounds normal. No stridor. No respiratory distress. She has no wheezes. She has no rales.  Abdominal: Soft. Bowel sounds are normal. She exhibits no distension. There is no tenderness. There is no rebound and no guarding.  Musculoskeletal: She exhibits no edema or tenderness.  Neurological: She is alert. She has normal strength. No cranial nerve deficit (no facial droop, extraocular movements intact, no slurred speech) or sensory deficit. She exhibits normal muscle tone. She displays no seizure activity. Coordination normal.  Skin: Skin is warm and dry. No rash noted.  Psychiatric: She has a normal mood  and affect.  Nursing note and vitals reviewed.    ED Treatments / Results  Labs (all labs ordered are listed, but only abnormal results are displayed) Labs Reviewed - No data to display  EKG None  Radiology No results found.  Procedures Procedures (including critical care time)  Medications Ordered in ED Medications - No data to display   Initial Impression / Assessment and Plan / ED Course  I have reviewed the triage vital signs and the nursing notes.  Pertinent labs & imaging results that were available during my care of the patient were reviewed by me and considered in my medical decision making (see chart for details).       Final Clinical Impressions(s) / ED Diagnoses MDM Vital signs reviewed.  No gross neurologic deficits appreciated on examination.  According to the patient this is not the worst headache ever, and it is not a thunderclap type headache.  Patient treated in the emergency department with intravenous Toradol, Compazine, and Benadryl.  Patient states that she feels a lot better after treatment.  No gross neurologic deficit appreciated on recheck.  Blood pressure was found to be slightly low at discharge.  Patient given oral fluids.  Blood pressure improved to 102 systolic.  Patient is ambulatory at discharge without problem.    Final diagnoses:  Bad headache    ED Discharge Orders    None       Ivery QualeBryant, Ronnett Pullin, Cordelia Poche-C 01/08/18 1623    Donnetta Hutchingook, Brian, MD 01/09/18 [id number]

## 2018-01-08 NOTE — ED Notes (Signed)
ED Provider at bedside. 

## 2018-01-08 NOTE — ED Notes (Signed)
Pt verbalized understanding of no driving and to use caution within 4 hours of taking pain meds due to meds cause drowsiness 

## 2018-01-08 NOTE — ED Triage Notes (Addendum)
Pt is having a throbbing headache and it has been going on for a week. Pt does have a history of migraines but states this does not feel like one. Is having sensitivity to light. Has been taking Tylenol and Excedrin with no help. Pt also nauseous. Pt hasn't taken any medication today. Pt states she is also having chills, but no fever noted today.

## 2020-10-02 ENCOUNTER — Other Ambulatory Visit: Payer: Self-pay

## 2020-10-02 ENCOUNTER — Encounter: Payer: Self-pay | Admitting: Emergency Medicine

## 2020-10-02 ENCOUNTER — Ambulatory Visit
Admission: EM | Admit: 2020-10-02 | Discharge: 2020-10-02 | Disposition: A | Discharge status: Home / Self Care | Payer: Self-pay | Attending: Emergency Medicine | Admitting: Emergency Medicine

## 2020-10-02 DIAGNOSIS — Z3202 Encounter for pregnancy test, result negative: Secondary | ICD-10-CM

## 2020-10-02 DIAGNOSIS — L02214 Cutaneous abscess of groin: Secondary | ICD-10-CM

## 2020-10-02 DIAGNOSIS — H6121 Impacted cerumen, right ear: Secondary | ICD-10-CM

## 2020-10-02 LAB — POCT URINE PREGNANCY: Preg Test, Ur: NEGATIVE

## 2020-10-02 MED ORDER — DOXYCYCLINE HYCLATE 100 MG PO CAPS: 100.0000 mg | ORAL_CAPSULE | Freq: Two times a day (BID) | ORAL | 0 refills | Status: DC

## 2020-10-02 NOTE — ED Triage Notes (Signed)
Red and painful lump to RT thigh x 1 week and having trouble hearing out of RT ear.

## 2020-10-02 NOTE — Discharge Instructions (Signed)
Abscess Apply warm compresses 3-4x daily for 10-15 minutes Wash site daily with warm water and mild soap Keep covered to avoid friction Take antibiotic as prescribed and to completion Follow up here or with PCP if symptoms persists Return or go to the ED if you have any new or worsening symptoms increased redness, swelling, pain, nausea, vomiting, fever, chills, etc...   Cerumen impaction Ear lavage performed Use OTC ibuprofen and/or tylenol Use OTC debrox ear drops to help dissolve residual wax Follow up with PCP

## 2020-10-02 NOTE — ED Provider Notes (Signed)
[facility]   [id number] 10/02/20 Arrival Time: 1135   TK:[id number]; changes in hearing  SUBJECTIVE:  Suzanne Shelton is a 30 y.o. female who presents with a possible abscess of her RT groin x 1 week. Symptoms occurred after wearing uncomfortable underwear and walking around.  Denies alleviating factors.  Worse to the touch.  Reports similar symptoms with cyst to tailbone that was removed.  Denies fever, chills, nausea, vomiting.  Complains of associated redness and swelling.  Also reports RT ear pain/ changes in hearing x 1 YEAR.  Symptoms began after getting water in ear.  Has tried OTC ear drops without relief.  Denies previous symptoms.  Denies rhinorrhea, congestion, sore throat, cough.    Also requests pregnancy test.  LMP in January, on depo.   ROS: As per HPI.  All other pertinent ROS negative.     History reviewed. No pertinent past medical history. Past Surgical History:  Procedure Laterality Date  . ABSCESS DRAINAGE     sacrum    No Known Allergies No current facility-administered medications on file prior to encounter.   Current Outpatient Medications on File Prior to Encounter  Medication Sig Dispense Refill  . butalbital-acetaminophen-caffeine (FIORICET/CODEINE) 50-325-40-30 MG capsule 1 or 2 po q6h prn headache. Take with food 12 capsule 0  . Prenatal Vit-Fe Sulfate-FA (PRENATAL VITAMIN PO) Take 1 tablet by mouth daily.     Social History   Socioeconomic History  . Marital status: Single    Spouse name: Not on file  . Number of children: Not on file  . Years of education: Not on file  . Highest education level: Not on file  Occupational History  . Not on file  Tobacco Use  . Smoking status: Never Smoker  . Smokeless tobacco: Never Used  Substance and Sexual Activity  . Alcohol use: No  . Drug use: No  . Sexual activity: Yes    Birth control/protection: None  Other Topics Concern  . Not on file  Social History Narrative  . Not on file    Social Determinants of Health   Financial Resource Strain: Not on file  Food Insecurity: Not on file  Transportation Needs: Not on file  Physical Activity: Not on file  Stress: Not on file  Social Connections: Not on file  Intimate Partner Violence: Not on file   No family history on file.  OBJECTIVE:  Vitals:   10/02/20 1141  BP: 117/80  Pulse: 97  Resp: 16  Temp: 98.2 F (36.8 C)  TempSrc: Oral  SpO2: 95%     General appearance: alert; no distress HEENT: NCAT; Ears: RT EAC obstructed by cerumen; Eyes: PERRL.  EOM grossly intact.  Nose: no rhinorrhea; Throat: tonsils nonerythematous, uvula midline  Skin: 0.5-1 cm induration of her RT groin, with mild overlying erythema, possible pustule formation; tender to touch; no active drainage Psychological: alert and cooperative; normal mood and affect  Procedure:  Consent granted.  Right ear lavage performed by RN.  TM visualized.  PT tolerated procedure well.     ASSESSMENT & PLAN:  1. Abscess of right groin   2. Impacted cerumen of right ear   3. Negative pregnancy test     Meds ordered this encounter  Medications  . DISCONTD: doxycycline (VIBRAMYCIN) 100 MG capsule    Sig: Take 1 capsule (100 mg total) by mouth 2 (two) times daily.    Dispense:  20 capsule    Refill:  0    Order Specific Question:  Supervising Provider    Answer:   Eustace Moore [[id number]]  . doxycycline (VIBRAMYCIN) 100 MG capsule    Sig: Take 1 capsule (100 mg total) by mouth 2 (two) times daily.    Dispense:  20 capsule    Refill:  0    Order Specific Question:   Supervising Provider    Answer:   Eustace Moore [[id number]]    Abscess Apply warm compresses 3-4x daily for 10-15 minutes Wash site daily with warm water and mild soap Keep covered to avoid friction Take antibiotic as prescribed and to completion Follow up here or with PCP if symptoms persists Return or go to the ED if you have any new or worsening symptoms increased  redness, swelling, pain, nausea, vomiting, fever, chills, etc...   Cerumen impaction Ear lavage performed Use OTC ibuprofen and/or tylenol Use OTC debrox ear drops to help dissolve residual wax Follow up with PCP  Reviewed expectations re: course of current medical issues. Questions answered. Outlined signs and symptoms indicating need for more acute intervention. Patient verbalized understanding. After Visit Summary given.          Rennis Harding, PA-C 10/02/20 1228

## 2020-11-11 ENCOUNTER — Ambulatory Visit
Admission: EM | Admit: 2020-11-11 | Discharge: 2020-11-11 | Disposition: A | Discharge status: Home / Self Care | Payer: Self-pay | Attending: Internal Medicine | Admitting: Internal Medicine

## 2020-11-11 ENCOUNTER — Other Ambulatory Visit: Payer: Self-pay

## 2020-11-11 ENCOUNTER — Ambulatory Visit (INDEPENDENT_AMBULATORY_CARE_PROVIDER_SITE_OTHER): Payer: Self-pay

## 2020-11-11 ENCOUNTER — Encounter: Payer: Self-pay | Admitting: Emergency Medicine

## 2020-11-11 DIAGNOSIS — R059 Cough, unspecified: Secondary | ICD-10-CM

## 2020-11-11 DIAGNOSIS — J209 Acute bronchitis, unspecified: Secondary | ICD-10-CM

## 2020-11-11 DIAGNOSIS — R062 Wheezing: Secondary | ICD-10-CM

## 2020-11-11 LAB — POCT URINE PREGNANCY: Preg Test, Ur: NEGATIVE

## 2020-11-11 MED ORDER — ALBUTEROL SULFATE HFA 108 (90 BASE) MCG/ACT IN AERS
2.0000 | INHALATION_SPRAY | Freq: Once | RESPIRATORY_TRACT | Status: AC
  Administered 2020-11-11: 2 via RESPIRATORY_TRACT

## 2020-11-11 MED ORDER — DOXYCYCLINE HYCLATE 100 MG PO CAPS: 100.0000 mg | ORAL_CAPSULE | Freq: Two times a day (BID) | ORAL | 0 refills | Status: DC

## 2020-11-11 MED ORDER — PREDNISONE 10 MG (21) PO TBPK: ORAL_TABLET | Freq: Every day | ORAL | 0 refills | Status: DC

## 2020-11-11 NOTE — Discharge Instructions (Addendum)
Use the albuterol inhaler 2 puffs every 4 hours for wheezing and cough for 5 days, then after that only as needed for wheezing and cough Take the prednisone pills as noted in the pack to help with the inflammation in your breathing tubes.  Come back if you get worse.

## 2020-11-11 NOTE — ED Triage Notes (Signed)
Cough, wheezing, x 1 month.

## 2020-11-11 NOTE — ED Provider Notes (Signed)
[facility]    CSN: [id number] Arrival date & time: 11/11/20  1803      History   Chief Complaint No chief complaint on file.   HPI Suzanne Shelton is a 30 y.o. female who started a cough 1 month ago and started with her allergies and takes Benadryl and allergies are better. She started wheezing 3 weeks and gest SOB with walking. Her cough is non productive, has had cough attacks. Denies afever or hx asthma.. Is not a smoker.   History reviewed. No pertinent past medical history.  There are no problems to display for this patient.   Past Surgical History:  Procedure Laterality Date  . ABSCESS DRAINAGE     sacrum     OB History    Gravida  3   Para  2   Term  2   Preterm      AB      Living  2     SAB      IAB      Ectopic      Multiple      Live Births               Home Medications    Prior to Admission medications   Medication Sig Start Date End Date Taking? Authorizing Provider  predniSONE (STERAPRED UNI-PAK 21 TAB) 10 MG (21) TBPK tablet Take by mouth daily. Take 6 tabs by mouth daily  for 2 days, then 5 tabs for 2 days, then 4 tabs for 2 days, then 3 tabs for 2 days, 2 tabs for 2 days, then 1 tab by mouth daily for 2 days 11/11/20  Yes Rodriguez-Southworth, Nettie Elm, PA-C  butalbital-acetaminophen-caffeine (FIORICET/CODEINE) 50-325-40-30 MG capsule 1 or 2 po q6h prn headache. Take with food 01/08/18   Ivery Quale, PA-C  doxycycline (VIBRAMYCIN) 100 MG capsule Take 1 capsule (100 mg total) by mouth 2 (two) times daily. 11/11/20   Rodriguez-Southworth, Nettie Elm, PA-C    Family History Family History  Problem Relation Age of Onset  . Diabetes Mother   . Hyperlipidemia Mother   . Ulcerative colitis Father     Social History Social History   Tobacco Use  . Smoking status: Never Smoker  . Smokeless tobacco: Never Used  Substance Use Topics  . Alcohol use: No  . Drug use: No     Allergies   Patient has no known  allergies.   Review of Systems Review of Systems  Constitutional: Negative for appetite change, chills, diaphoresis and fever.  HENT: Positive for congestion, postnasal drip, rhinorrhea and sneezing. Negative for ear discharge, ear pain and tinnitus.   Eyes: Negative for discharge.  Respiratory: Positive for cough, shortness of breath and wheezing. Negative for chest tightness.   Cardiovascular: Negative for chest pain.  Musculoskeletal: Negative for myalgias.  Neurological: Negative for headaches.     Physical Exam Triage Vital Signs ED Triage Vitals [11/11/20 1857]  Enc Vitals Group     BP 135/90     Pulse Rate 90     Resp 18     Temp 98.2 F (36.8 C)     Temp Source Oral     SpO2 94 %     Weight      Height      Head Circumference      Peak Flow      Pain Score 0     Pain Loc      Pain Edu?  Excl. in GC?    No data found.  Updated Vital Signs BP 135/90 (BP Location: Right Arm)   Pulse 90   Temp 98.2 F (36.8 C) (Oral)   Resp 18   LMP 10/06/2020   SpO2 94%  Pulse ox repeated without a mask and went up to 96 %, but with deep breaths dropped to 91% Visual Acuity Right Eye Distance:   Left Eye Distance:   Bilateral Distance:    Right Eye Near:   Left Eye Near:    Bilateral Near:      Physical Exam Constitutional:      General: He is not in acute distress.    Appearance: He is not toxic-appearing.  HENT:     Head: Normocephalic.     Right Ear: Tympanic membrane, ear canal and external ear normal.     Left Ear: Ear canal and external ear normal.     Nose: Nose normal.     Mouth/Throat:     Mouth: Mucous membranes are moist.     Pharynx: Oropharynx is clear.  Eyes:     General: No scleral icterus.    Conjunctiva/sclera: Conjunctivae normal.  Cardiovascular:     Rate and Rhythm: Normal rate and regular rhythm.     Heart sounds: No murmur heard.   Pulmonary:     Effort: Pulmonary effort is normal. No respiratory distress.     Breath sounds:  Wheezing present throught.     Comments: Has auditory wheezing Musculoskeletal:        General: Normal range of motion.     Cervical back: Neck supple.  Lymphadenopathy:     Cervical: No cervical adenopathy.  Skin:    General: Skin is warm and dry.     Findings: No rash.  Neurological:     Mental Status: He is alert and oriented to person, place, and time.     Gait: Gait normal.  Psychiatric:        Mood and Affect: Mood normal.        Behavior: Behavior normal.        Thought Content: Thought content normal.        Judgment: Judgment normal.     UC Treatments / Results  Labs (all labs ordered are listed, but only abnormal results are displayed) Labs Reviewed  POCT URINE PREGNANCY    EKG   Radiology No results found. EXAM: CHEST - 2 VIEW  COMPARISON:  None  FINDINGS: Normal heart size, mediastinal contours, and pulmonary vascularity.  Central peribronchial thickening.  Lungs clear.  No pleural effusion or pneumothorax.  Bones unremarkable.  IMPRESSION: Mild bronchitic changes without infiltrate.   Electronically Signed   By: Ulyses Southward M.D.   On: 11/11/2020 19:43 Procedures Procedures (including critical care time)  Medications Ordered in UC Medications  albuterol (VENTOLIN HFA) 108 (90 Base) MCG/ACT inhaler 2 puff (2 puffs Inhalation Given 11/11/20 2010)    Initial Impression / Assessment and Plan / UC Course  I have reviewed the triage vital signs and the nursing notes. Pertinent labs & imaging results that were available during my care of the patient were reviewed by me and considered in my medical decision making (see chart for details). She has bronchitis and I placed her on Doxy, prednisone and from here I gave her an albuterol inhaler. See instructions.   Final Clinical Impressions(s) / UC Diagnoses   Final diagnoses:  Acute bronchitis, unspecified organism     Discharge Instructions  Use the albuterol inhaler 2 puffs  every 4 hours for wheezing and cough for 5 days, then after that only as needed for wheezing and cough Take the prednisone pills as noted in the pack to help with the inflammation in your breathing tubes.  Come back if you get worse.     ED Prescriptions    Medication Sig Dispense Auth. Provider   predniSONE (STERAPRED UNI-PAK 21 TAB) 10 MG (21) TBPK tablet Take by mouth daily. Take 6 tabs by mouth daily  for 2 days, then 5 tabs for 2 days, then 4 tabs for 2 days, then 3 tabs for 2 days, 2 tabs for 2 days, then 1 tab by mouth daily for 2 days 42 tablet Rodriguez-Southworth, Nettie Elm, PA-C   doxycycline (VIBRAMYCIN) 100 MG capsule Take 1 capsule (100 mg total) by mouth 2 (two) times daily. 20 capsule Rodriguez-Southworth, Nettie Elm, PA-C     PDMP not reviewed this encounter.   Garey Ham, [place] 11/15/20 [phone]

## 2021-03-17 ENCOUNTER — Ambulatory Visit
Admission: EM | Admit: 2021-03-17 | Discharge: 2021-03-17 | Disposition: A | Discharge status: Home / Self Care | Payer: Self-pay | Attending: Family Medicine | Admitting: Family Medicine

## 2021-03-17 DIAGNOSIS — J4521 Mild intermittent asthma with (acute) exacerbation: Secondary | ICD-10-CM

## 2021-03-17 DIAGNOSIS — J209 Acute bronchitis, unspecified: Secondary | ICD-10-CM

## 2021-03-17 MED ORDER — DOXYCYCLINE HYCLATE 100 MG PO CAPS: 100.0000 mg | ORAL_CAPSULE | Freq: Two times a day (BID) | ORAL | 0 refills | Status: DC

## 2021-03-17 MED ORDER — LEVOCETIRIZINE DIHYDROCHLORIDE 5 MG PO TABS: 5.0000 mg | ORAL_TABLET | Freq: Every evening | ORAL | 0 refills | Status: DC

## 2021-03-17 MED ORDER — ALBUTEROL SULFATE HFA 108 (90 BASE) MCG/ACT IN AERS
2.0000 | INHALATION_SPRAY | Freq: Four times a day (QID) | RESPIRATORY_TRACT | Status: DC | PRN
  Administered 2021-03-17: 2 via RESPIRATORY_TRACT

## 2021-03-17 MED ORDER — PREDNISONE 20 MG PO TABS: 40.0000 mg | ORAL_TABLET | Freq: Every day | ORAL | 0 refills | Status: DC

## 2021-03-17 NOTE — ED Triage Notes (Signed)
Pt presents with c/o  cough for past week, pt states it has been chronic and she ran out of inhaler last week

## 2021-03-17 NOTE — ED Provider Notes (Signed)
[facility]    CSN: [id number] Arrival date & time: 03/17/21  8144      History   Chief Complaint Chief Complaint  Patient presents with   Cough    HPI Suzanne Shelton is a 30 y.o. female.   HPI Patient seen here today for worsening cough, wheezing x 5 days after using the last of her albuterol inhaler. No formal diagnosis of asthma or reactive airway disease. She was seen here in April with the same symptoms and treatment prescribed at that time alleviated symptoms.  She has intermittently used albuterol inhaler since that time for intermittent wheezing. Unknown of specific trigger but endorses chronic allergies and taken cetrizine as needed.   History reviewed. No pertinent past medical history.  There are no problems to display for this patient.   Past Surgical History:  Procedure Laterality Date   ABSCESS DRAINAGE     sacrum     OB History     Gravida  3   Para  2   Term  2   Preterm      AB      Living  2      SAB      IAB      Ectopic      Multiple      Live Births               Home Medications    Prior to Admission medications   Medication Sig Start Date End Date Taking? Authorizing Provider  doxycycline (VIBRAMYCIN) 100 MG capsule Take 1 capsule (100 mg total) by mouth 2 (two) times daily. 03/17/21  Yes Bing Neighbors, FNP  levocetirizine (XYZAL) 5 MG tablet Take 1 tablet (5 mg total) by mouth every evening. 03/17/21  Yes Bing Neighbors, FNP  predniSONE (DELTASONE) 20 MG tablet Take 2 tablets (40 mg total) by mouth daily with breakfast. 03/17/21  Yes Bing Neighbors, FNP  butalbital-acetaminophen-caffeine (FIORICET/CODEINE) 50-325-40-30 MG capsule 1 or 2 po q6h prn headache. Take with food 01/08/18   Ivery Quale, PA-C    Family History Family History  Problem Relation Age of Onset   Diabetes Mother    Hyperlipidemia Mother    Ulcerative colitis Father     Social History Social History   Tobacco  Use   Smoking status: Never   Smokeless tobacco: Never  Substance Use Topics   Alcohol use: No   Drug use: No     Allergies   Patient has no known allergies.   Review of Systems Review of Systems Pertinent negatives listed in HPI  Physical Exam Triage Vital Signs ED Triage Vitals [03/17/21 1030]  Enc Vitals Group     BP 117/78     Pulse Rate 100     Resp 20     Temp 98.5 F (36.9 C)     Temp src      SpO2 93 %     Weight      Height      Head Circumference      Peak Flow      Pain Score 0     Pain Loc      Pain Edu?      Excl. in GC?    No data found.  Updated Vital Signs BP 117/78   Pulse 100   Temp 98.5 F (36.9 C)   Resp 20   LMP 03/08/2021   SpO2 93%   Visual Acuity Right Eye Distance:  Left Eye Distance:   Bilateral Distance:    Right Eye Near:   Left Eye Near:    Bilateral Near:     Physical Exam General appearance: alert, Ill-appearing, no distress Head: Normocephalic, without obvious abnormality, atraumatic ENT: Ears normal. Nares congestion, oropharynx normal Respiratory: Respirations even , unlabored, coarse lung sound, generalized wheeze Heart: Rate and rhythm normal.  Abdomen: BS +, no distention, no rebound tenderness, or no mass Extremities: No gross deformities Skin: Skin color, texture, turgor normal. No rashes seen  Psych: Appropriate mood and affect. Neurologic: GCS 15, normal coordination, normal gait  UC Treatments / Results  Labs (all labs ordered are listed, but only abnormal results are displayed) Labs Reviewed - No data to display  EKG   Radiology No results found.  Procedures Procedures (including critical care time)  Medications Ordered in UC Medications  albuterol (VENTOLIN HFA) 108 (90 Base) MCG/ACT inhaler 2 puff (2 puffs Inhalation Given 03/17/21 1058)    Initial Impression / Assessment and Plan / UC Course  I have reviewed the triage vital signs and the nursing notes.  Pertinent labs & imaging  results that were available during my care of the patient were reviewed by me and considered in my medical decision making (see chart for details).    Mild reactive airway disease with acute bronchitis  Treatment per discharge medication orders. Advised if symptoms persist schedule a follow-up with PCP for further work-up given no history of chronic respiratory disease. ER precautions.  RTC as needed  Final Clinical Impressions(s) / UC Diagnoses   Final diagnoses:  Mild intermittent reactive airway disease with wheezing with acute exacerbation  Acute bronchitis, unspecified organism   Discharge Instructions   None    ED Prescriptions     Medication Sig Dispense Auth. Provider   levocetirizine (XYZAL) 5 MG tablet Take 1 tablet (5 mg total) by mouth every evening. 90 tablet Bing Neighbors, FNP   doxycycline (VIBRAMYCIN) 100 MG capsule Take 1 capsule (100 mg total) by mouth 2 (two) times daily. 20 capsule Bing Neighbors, FNP   predniSONE (DELTASONE) 20 MG tablet Take 2 tablets (40 mg total) by mouth daily with breakfast. 10 tablet Bing Neighbors, FNP      PDMP not reviewed this encounter.   Bing Neighbors, FNP 03/17/21 [phone]

## 2021-05-23 ENCOUNTER — Inpatient Hospital Stay (HOSPITAL_COMMUNITY)
Admission: AD | Admit: 2021-05-23 | Discharge: 2021-05-24 | Disposition: A | Discharge status: Home / Self Care | Payer: Medicaid Other | Attending: Obstetrics & Gynecology | Admitting: Obstetrics & Gynecology

## 2021-05-23 ENCOUNTER — Inpatient Hospital Stay (HOSPITAL_COMMUNITY): Payer: Medicaid Other

## 2021-05-23 ENCOUNTER — Other Ambulatory Visit: Payer: Self-pay

## 2021-05-23 ENCOUNTER — Encounter (HOSPITAL_COMMUNITY): Payer: Self-pay | Admitting: Emergency Medicine

## 2021-05-23 DIAGNOSIS — Z3491 Encounter for supervision of normal pregnancy, unspecified, first trimester: Secondary | ICD-10-CM

## 2021-05-23 DIAGNOSIS — O26891 Other specified pregnancy related conditions, first trimester: Secondary | ICD-10-CM | POA: Insufficient documentation

## 2021-05-23 DIAGNOSIS — O468X1 Other antepartum hemorrhage, first trimester: Secondary | ICD-10-CM

## 2021-05-23 DIAGNOSIS — O209 Hemorrhage in early pregnancy, unspecified: Secondary | ICD-10-CM

## 2021-05-23 DIAGNOSIS — O208 Other hemorrhage in early pregnancy: Secondary | ICD-10-CM | POA: Diagnosis present

## 2021-05-23 DIAGNOSIS — Z7952 Long term (current) use of systemic steroids: Secondary | ICD-10-CM | POA: Diagnosis not present

## 2021-05-23 DIAGNOSIS — O418X10 Other specified disorders of amniotic fluid and membranes, first trimester, not applicable or unspecified: Secondary | ICD-10-CM | POA: Insufficient documentation

## 2021-05-23 DIAGNOSIS — Z3A10 10 weeks gestation of pregnancy: Secondary | ICD-10-CM | POA: Insufficient documentation

## 2021-05-23 DIAGNOSIS — O4690 Antepartum hemorrhage, unspecified, unspecified trimester: Secondary | ICD-10-CM

## 2021-05-23 LAB — CBC
HCT: 39.4 % (ref 36.0–46.0)
Hemoglobin: 13.5 g/dL (ref 12.0–15.0)
MCH: 30.7 pg (ref 26.0–34.0)
MCHC: 34.3 g/dL (ref 30.0–36.0)
MCV: 89.5 fL (ref 80.0–100.0)
Platelets: 285 10*3/uL (ref 150–400)
RBC: 4.4 MIL/uL (ref 3.87–5.11)
RDW: 12.5 % (ref 11.5–15.5)
WBC: 13.5 10*3/uL — ABNORMAL HIGH (ref 4.0–10.5)
nRBC: 0 % (ref 0.0–0.2)

## 2021-05-23 LAB — HCG, QUANTITATIVE, PREGNANCY: hCG, Beta Chain, Quant, S: 52484 m[IU]/mL — ABNORMAL HIGH (ref <5)

## 2021-05-23 LAB — POC URINE PREG, ED: Preg Test, Ur: POSITIVE — AB

## 2021-05-23 NOTE — ED Triage Notes (Signed)
Patient here for evaluation of vaginal bleeding and lower abdominal pain that started at approximately 01400 today. Patient alert, oriented, and in no apparent distress at this time. Bleeding is constant but had not had to change sanitary napkin since onset of bleeding.

## 2021-05-23 NOTE — MAU Provider Note (Signed)
Chief Complaint:  Vaginal Bleeding and Abdominal Cramping    HPI: Suzanne Shelton is a 30 y.o. G4P2003 at [redacted]w[redacted]d who presents to maternity admissions reporting vaginal bleeding and cramping that started around 2pm today. Patient reports she is not currently having any bleeding and only has mild cramping. She denies fever, urinary s/s, odor, or itching. LMP 8/11.  Pregnancy Course:   History reviewed. No pertinent past medical history. OB History  Gravida Para Term Preterm AB Living  4 2 2     3   SAB IAB Ectopic Multiple Live Births        1 3    # Outcome Date GA Lbr Len/2nd Weight Sex Delivery Anes PTL Lv  4A Gravida           4B Current           3 Term      Vag-Spont     2 Term      Vag-Spont     1 49      Vag-Spont      Past Surgical History:  Procedure Laterality Date   ABSCESS DRAINAGE     sacrum    Family History  Problem Relation Age of Onset   Diabetes Mother    Hyperlipidemia Mother    Ulcerative colitis Father    Social History   Tobacco Use   Smoking status: Never   Smokeless tobacco: Never  Substance Use Topics   Alcohol use: No   Drug use: No   No Known Allergies No medications prior to admission.    I have reviewed patient's Past Medical Hx, Surgical Hx, Family Hx, Social Hx, medications and allergies.   ROS:  Review of Systems  Constitutional: Negative.   Respiratory: Negative.    Cardiovascular: Negative.   Gastrointestinal:  Positive for abdominal pain (cramping).  Genitourinary:  Positive for vaginal bleeding. Negative for dysuria and frequency.  Musculoskeletal: Negative.   Neurological: Negative.   Psychiatric/Behavioral: Negative.     Physical Exam  Patient Vitals for the past 24 hrs:  BP Temp Temp src Pulse Resp SpO2 Height Weight  05/24/21 0059 -- -- -- -- 20 -- -- --  05/23/21 2044 107/71 98.1 F (36.7 C) Oral 88 20 -- 5\' 4"  (1.626 m) 83.8 kg  05/23/21 1806 130/81 98.9 F (37.2 C) -- 99 16 98 % -- --    Constitutional: well-developed, well-nourished female in no acute distress.  Cardiovascular: normal rate Respiratory: normal effort GI: abd soft, non-tender MS: extremities nontender, no edema, normal ROM Neurologic: alert and oriented x 4.  Pelvic: blind swabs obtained      Labs: Results for orders placed or performed during the hospital encounter of 05/23/21 (from the past 24 hour(s))  POC Urine Pregnancy, ED (not at [facility])     Status: Abnormal   Collection Time: 05/23/21  6:22 PM  Result Value Ref Range   Preg Test, Ur POSITIVE (A) NEGATIVE  CBC     Status: Abnormal   Collection Time: 05/23/21  8:54 PM  Result Value Ref Range   WBC 13.5 (H) 4.0 - 10.5 K/uL   RBC 4.40 3.87 - 5.11 MIL/uL   Hemoglobin 13.5 12.0 - 15.0 g/dL   HCT 05/25/21 05/25/21 - 66.5 %   MCV 89.5 80.0 - 100.0 fL   MCH 30.7 26.0 - 34.0 pg   MCHC 34.3 30.0 - 36.0 g/dL   RDW 99.3 57.0 - 17.7 %   Platelets 285 150 - 400 K/uL  nRBC 0.0 0.0 - 0.2 %  hCG, quantitative, pregnancy     Status: Abnormal   Collection Time: 05/23/21  8:54 PM  Result Value Ref Range   hCG, Beta Chain, Quant, S 52,484 (H) <5 mIU/mL  Wet prep, genital     Status: Abnormal   Collection Time: 05/23/21  9:24 PM  Result Value Ref Range   Yeast Wet Prep HPF POC NONE SEEN NONE SEEN   Trich, Wet Prep NONE SEEN NONE SEEN   Clue Cells Wet Prep HPF POC NONE SEEN NONE SEEN   WBC, Wet Prep HPF POC MODERATE (A) NONE SEEN   Sperm NONE SEEN     Imaging:  US OB Comp Less 14 Wks  Result Date: 05/23/2021 CLINICAL DATA:  Vaginal bleeding. Last menstrual period 03/08/2021. Gestational age by last menstrual period 10 weeks and 6 days. Estimated due date by last menstrual period 12/13/2021. G4P3. EXAM: OBSTETRIC <14 WK ULTRASOUND TECHNIQUE: Transabdominal ultrasound was performed for evaluation of the gestation as well as the maternal uterus and adnexal regions. COMPARISON:  None. FINDINGS: Intrauterine gestational sac: Single Yolk sac:  Not Visualized.  Embryo:  Visualized. Cardiac Activity: Visualized. Heart Rate: 160 bpm CRL:   36  mm   10 w 3 d Subchorionic hemorrhage:  Small. Maternal uterus/adnexae: Bilateral ovaries are not visualized continuous otherwise unremarkable. Other: No free pelvic fluid. IMPRESSION: 1. Single live intrauterine pregnancy with a gestational age by ultrasound of 10 weeks and 3 days which is concordant with a gestational age by last menstrual period of 10 weeks and 6 days. 2. Small subchorionic hemorrhage. Electronically Signed   By: Tish Frederickson M.D.   On: 05/23/2021 22:55    MAU Course: Orders Placed This Encounter  Procedures   Wet prep, genital   US OB Comp Less 14 Wks   CBC   hCG, quantitative, pregnancy   POC Urine Pregnancy, ED (not at [facility])   Discharge patient   No orders of the defined types were placed in this encounter.   MDM:  Assessment: 1. Vaginal bleeding in pregnancy   2. Vaginal bleeding affecting early pregnancy   3. Normal IUP (intrauterine pregnancy) on prenatal ultrasound, first trimester   4. [redacted] weeks gestation of pregnancy   5. Subchorionic hematoma in first trimester, single or unspecified fetus     Plan: Discharge home in stable condition Pelvic rest Strict return precautions Keep appointment as scheduled at [facility] Return to MAU as needed    Follow-up Information     [facility], [facility] Dept Personal Follow up.   Why: as scheduled for appointment. Return to MAU as needed. Contact information: [address] [place] [address] [phone]                 Allergies as of 05/24/2021   No Known Allergies      Medication List     STOP taking these medications    butalbital-acetaminophen-caffeine 50-325-40-30 MG capsule Commonly known as: Fioricet/Codeine   doxycycline 100 MG capsule Commonly known as: VIBRAMYCIN   levocetirizine 5 MG tablet Commonly known as: XYZAL   predniSONE 20 MG tablet Commonly known as:  Mathews Robinsons, CNM 05/24/2021 5:47 AM

## 2021-05-23 NOTE — ED Provider Notes (Signed)
Emergency Medicine Provider OB Triage Evaluation Note  Suzanne Shelton is a 30 y.o. female, [id number], at Unknown gestation who presents to the emergency department with complaints of Vaginal bleeding.  That started at about 1400 today.  Having cramping pain.  No ultrasounds this pregnancy.  Had a positive test with health department in [place].  No fevers. History of placenta previa. She thinks she is [redacted] weeks pregnant.   Review of  Systems  Positive:  Vaginal bleeding Negative: fevers  Physical Exam  BP 130/81 (BP Location: Right Arm)   Pulse 99   Temp 98.9 F (37.2 C)   Resp 16   LMP 03/08/2021   SpO2 98%  General: Awake, no distress  HEENT: Atraumatic  Resp: Normal effort  Cardiac: Normal rate Abd: Nondistended, nontender  MSK: Moves all extremities without difficulty Neuro: Speech clear  Medical Decision Making  Pt evaluated for pregnancy concern and is stable for transfer to MAU. Pt is in agreement with plan for transfer.  6:51 PM Discussed with MAU APP, who accepts patient in transfer.  Clinical Impression   1. Vaginal bleeding in pregnancy        Norman Clay 05/23/21 1851    Milagros Loll, MD 05/24/21 [phone]

## 2021-05-23 NOTE — MAU Note (Signed)
PT SAYS VB STARTED TODAY AT 2 PM-  PAD ON IN TRIAGE- DARK D/C SEES BLOOD WHEN SHE WIPES CRAMPING STARTED AT 9AM

## 2021-05-24 LAB — WET PREP, GENITAL
Clue Cells Wet Prep HPF POC: NONE SEEN
Sperm: NONE SEEN
Trich, Wet Prep: NONE SEEN
Yeast Wet Prep HPF POC: NONE SEEN

## 2021-05-24 NOTE — Discharge Instructions (Signed)

## 2021-05-26 LAB — GC/CHLAMYDIA PROBE AMP (~~LOC~~) NOT AT ARMC
Chlamydia: NEGATIVE
Comment: NEGATIVE
Comment: NORMAL
Neisseria Gonorrhea: NEGATIVE

## 2021-10-13 ENCOUNTER — Inpatient Hospital Stay (HOSPITAL_COMMUNITY)
Admission: AD | Admit: 2021-10-13 | Discharge: 2021-10-13 | Disposition: A | Discharge status: Home / Self Care | Payer: Medicaid Other | Attending: Family Medicine | Admitting: Family Medicine

## 2021-10-13 ENCOUNTER — Other Ambulatory Visit: Payer: Self-pay

## 2021-10-13 ENCOUNTER — Encounter (HOSPITAL_COMMUNITY): Payer: Self-pay | Admitting: Obstetrics & Gynecology

## 2021-10-13 ENCOUNTER — Inpatient Hospital Stay (HOSPITAL_COMMUNITY): Payer: Medicaid Other

## 2021-10-13 ENCOUNTER — Inpatient Hospital Stay (HOSPITAL_BASED_OUTPATIENT_CLINIC_OR_DEPARTMENT_OTHER)
Admit: 2021-10-13 | Discharge: 2021-10-13 | Disposition: A | Discharge status: Home / Self Care | Payer: Medicaid Other | Attending: Student | Admitting: Student

## 2021-10-13 DIAGNOSIS — O26893 Other specified pregnancy related conditions, third trimester: Secondary | ICD-10-CM

## 2021-10-13 DIAGNOSIS — R252 Cramp and spasm: Secondary | ICD-10-CM | POA: Insufficient documentation

## 2021-10-13 DIAGNOSIS — Z3A31 31 weeks gestation of pregnancy: Secondary | ICD-10-CM

## 2021-10-13 DIAGNOSIS — R102 Pelvic and perineal pain: Secondary | ICD-10-CM | POA: Diagnosis not present

## 2021-10-13 DIAGNOSIS — M79606 Pain in leg, unspecified: Secondary | ICD-10-CM | POA: Diagnosis not present

## 2021-10-13 LAB — CBC
HCT: 37.5 % (ref 36.0–46.0)
Hemoglobin: 12.4 g/dL (ref 12.0–15.0)
MCH: 29.7 pg (ref 26.0–34.0)
MCHC: 33.1 g/dL (ref 30.0–36.0)
MCV: 89.9 fL (ref 80.0–100.0)
Platelets: 233 10*3/uL (ref 150–400)
RBC: 4.17 MIL/uL (ref 3.87–5.11)
RDW: 13.5 % (ref 11.5–15.5)
WBC: 9.2 10*3/uL (ref 4.0–10.5)
nRBC: 0 % (ref 0.0–0.2)

## 2021-10-13 LAB — COMPREHENSIVE METABOLIC PANEL
ALT: 11 U/L (ref 0–44)
AST: 18 U/L (ref 15–41)
Albumin: 2.8 g/dL — ABNORMAL LOW (ref 3.5–5.0)
Alkaline Phosphatase: 95 U/L (ref 38–126)
Anion gap: 8 (ref 5–15)
BUN: 8 mg/dL (ref 6–20)
CO2: 22 mmol/L (ref 22–32)
Calcium: 8.9 mg/dL (ref 8.9–10.3)
Chloride: 106 mmol/L (ref 98–111)
Creatinine, Ser: 0.38 mg/dL — ABNORMAL LOW (ref 0.44–1.00)
GFR, Estimated: >60 mL/min (ref >60)
Glucose, Bld: 142 mg/dL — ABNORMAL HIGH (ref 70–99)
Potassium: 3.3 mmol/L — ABNORMAL LOW (ref 3.5–5.1)
Sodium: 136 mmol/L (ref 135–145)
Total Bilirubin: 0.3 mg/dL (ref 0.3–1.2)
Total Protein: 6 g/dL — ABNORMAL LOW (ref 6.5–8.1)

## 2021-10-13 LAB — URINALYSIS, ROUTINE W REFLEX MICROSCOPIC
Bacteria, UA: NONE SEEN
Bilirubin Urine: NEGATIVE
Glucose, UA: NEGATIVE mg/dL
Hgb urine dipstick: NEGATIVE
Ketones, ur: 5 mg/dL — AB
Nitrite: NEGATIVE
Protein, ur: NEGATIVE mg/dL
Specific Gravity, Urine: 1.017 (ref 1.005–1.030)
pH: 6 (ref 5.0–8.0)

## 2021-10-13 MED ORDER — IOHEXOL 350 MG/ML SOLN
75.0000 mL | Freq: Once | INTRAVENOUS | Status: AC | PRN
  Administered 2021-10-13: 75 mL via INTRAVENOUS

## 2021-10-13 NOTE — Progress Notes (Signed)
Bilateral lower extremity venous duplex has been completed. ?Preliminary results can be found in CV Proc through chart review.  ?Results were given to Judeth Horn NP. ? ?10/13/21 2:09 PM ?Olen Cordial RVT   ?

## 2021-10-13 NOTE — MAU Note (Signed)
CT placed an order for transport to come and retrieve the patient. Judeth Horn, NP, made aware. ?

## 2021-10-13 NOTE — MAU Provider Note (Addendum)
?History  ?  ? ?CSN: [id number] ? ?Arrival date and time: 10/13/21 1146 ? ? Event Date/Time  ? First Provider Initiated Contact with Patient 10/13/21 1230   ?  ? ?Chief Complaint  ?Patient presents with  ? Leg Pain  ? muscle cramps  ? ?Suzanne Shelton is a 31 year old [id number] female who is [redacted]w[redacted]d pregnant presents with chief complaint of right leg cramping and pain. She states that a few months ago, she experienced bilateral leg cramping that resolved with no intervention. She states she began experiencing right leg cramping and pain on Thursday or Friday of last week. The pain is located in her entire right leg and radiated into her pelvis last night. She states the pain is constant and is worsening. She rates it a 10/10 pain and experiences sharp pain with movement. At rest, her pain is dull and aching. She tried a topical muscle relaxer, which briefly reduced symptoms but symptoms returned when she stood up to walk. She states she also tried using a warm moist cloth and mustard for cramping, but neither relieved symptoms.  ? ?Leg Pain  ?The incident occurred 3 to 5 days ago. There was no injury mechanism. The pain is present in the right thigh and right leg. The quality of the pain is described as stabbing, aching and cramping. The pain is at a severity of 10/10. The pain is severe. The pain has been Worsening since onset. Pertinent negatives include no loss of motion, loss of sensation or numbness. She reports no foreign bodies present. The symptoms are aggravated by palpation and weight bearing. She has tried rest, acetaminophen, heat and immobilization for the symptoms. The treatment provided no relief.  ? ?OB History   ? ? Gravida  ?4  ? Para  ?3  ? Term  ?3  ? Preterm  ?   ? AB  ?   ? Living  ?3  ?  ? ? SAB  ?   ? IAB  ?   ? Ectopic  ?   ? Multiple  ?0  ? Live Births  ?3  ?   ?  ?  ? ? ?No past medical history on file. ? ?Past Surgical History:  ?Procedure Laterality Date  ? ABSCESS DRAINAGE    ? sacrum    ? ? ?Family History  ?Problem Relation Age of Onset  ? Diabetes Mother   ? Hyperlipidemia Mother   ? Ulcerative colitis Father   ? ? ?Social History  ? ?Tobacco Use  ? Smoking status: Never  ? Smokeless tobacco: Never  ?Substance Use Topics  ? Alcohol use: No  ? Drug use: No  ? ? ?Allergies: No Known Allergies ? ?Medications Prior to Admission  ?Medication Sig Dispense Refill Last Dose  ? cholecalciferol (VITAMIN D3) 25 MCG (1000 UNIT) tablet Take 1,000 Units by mouth daily.   10/12/2021  ? Prenatal Vit-Fe Fumarate-FA (PRENATAL MULTIVITAMIN) TABS tablet Take 1 tablet by mouth daily at 12 noon.   Past Week  ? ? ?Review of Systems  ?Constitutional:  Negative for chills, diaphoresis and fever.  ?Eyes:  Negative for photophobia and visual disturbance.  ?Respiratory:  Positive for shortness of breath. Negative for chest tightness.   ?     Shob onset began with leg cramping and pain  ?Cardiovascular:  Positive for leg swelling. Negative for chest pain and palpitations.  ?     B/L leg swelling last night  ?Gastrointestinal:  Negative for diarrhea, nausea and  vomiting.  ?Genitourinary:  Negative for pelvic pain, vaginal bleeding and vaginal discharge.  ?Skin:  Positive for color change.  ?     B/L leg erythema last night   ?Neurological:  Positive for light-headedness. Negative for dizziness, syncope, numbness and headaches.  ?Physical Exam  ? ?Blood pressure 117/70, pulse (!) 106, temperature 98.3 ?F (36.8 ?C), temperature source Oral, resp. rate 20, height 5\' 4"  (1.626 m), weight 93.5 kg, last menstrual period 03/08/2021, SpO2 100 %, unknown if currently breastfeeding. ? ?Physical Exam ?Constitutional:   ?   General: She is not in acute distress. ?   Appearance: Normal appearance. She is not ill-appearing.  ?HENT:  ?   Head: Normocephalic and atraumatic.  ?   Right Ear: External ear normal.  ?   Left Ear: External ear normal.  ?   Nose: Nose normal.  ?Eyes:  ?   General:     ?   Right eye: No discharge.     ?   Left eye:  No discharge.  ?Cardiovascular:  ?   Rate and Rhythm: Regular rhythm. Tachycardia present.  ?   Pulses: Normal pulses.  ?   Heart sounds: Normal heart sounds. No murmur heard. ?  No friction rub. No gallop.  ?Pulmonary:  ?   Effort: Pulmonary effort is normal. No respiratory distress.  ?   Breath sounds: Normal breath sounds. No stridor.  ?Abdominal:  ?   General: There is no distension.  ?   Palpations: Abdomen is soft.  ?   Tenderness: There is no abdominal tenderness. There is no guarding or rebound.  ?Musculoskeletal:     ?   General: Tenderness present. No swelling, deformity or signs of injury. Normal range of motion.  ?   Right lower leg: No edema.  ?   Left lower leg: No edema.  ?   Comments: Right thigh and calf tender to palpation  ?Skin: ?   General: Skin is warm and dry.  ?   Coloration: Skin is not pale.  ?   Findings: No erythema or rash.  ?Neurological:  ?   General: No focal deficit present.  ?   Mental Status: She is alert and oriented to person, place, and time.  ?Psychiatric:     ?   Mood and Affect: Mood normal.     ?   Behavior: Behavior normal.  ? ? ?MAU Course  ?Procedures ?Results for orders placed or performed during the hospital encounter of 10/13/21 (from the past 24 hour(s))  ?Urinalysis, Routine w reflex microscopic Urine, Clean Catch     Status: Abnormal  ? Collection Time: 10/13/21 12:23 PM  ?Result Value Ref Range  ? Color, Urine YELLOW YELLOW  ? APPearance HAZY (A) CLEAR  ? Specific Gravity, Urine 1.017 1.005 - 1.030  ? pH 6.0 5.0 - 8.0  ? Glucose, UA NEGATIVE NEGATIVE mg/dL  ? Hgb urine dipstick NEGATIVE NEGATIVE  ? Bilirubin Urine NEGATIVE NEGATIVE  ? Ketones, ur 5 (A) NEGATIVE mg/dL  ? Protein, ur NEGATIVE NEGATIVE mg/dL  ? Nitrite NEGATIVE NEGATIVE  ? Leukocytes,Ua LARGE (A) NEGATIVE  ? RBC / HPF 0-5 0 - 5 RBC/hpf  ? WBC, UA 11-20 0 - 5 WBC/hpf  ? Bacteria, UA NONE SEEN NONE SEEN  ? Squamous Epithelial / LPF 0-5 0 - 5  ? Mucus PRESENT   ?CBC     Status: None  ? Collection Time:  10/13/21 12:43 PM  ?Result Value Ref Range  ? WBC  9.2 4.0 - 10.5 K/uL  ? RBC 4.17 3.87 - 5.11 MIL/uL  ? Hemoglobin 12.4 12.0 - 15.0 g/dL  ? HCT 37.5 36.0 - 46.0 %  ? MCV 89.9 80.0 - 100.0 fL  ? MCH 29.7 26.0 - 34.0 pg  ? MCHC 33.1 30.0 - 36.0 g/dL  ? RDW 13.5 11.5 - 15.5 %  ? Platelets 233 150 - 400 K/uL  ? nRBC 0.0 0.0 - 0.2 %  ?Comprehensive metabolic panel     Status: Abnormal  ? Collection Time: 10/13/21 12:43 PM  ?Result Value Ref Range  ? Sodium 136 135 - 145 mmol/L  ? Potassium 3.3 (L) 3.5 - 5.1 mmol/L  ? Chloride 106 98 - 111 mmol/L  ? CO2 22 22 - 32 mmol/L  ? Glucose, Bld 142 (H) 70 - 99 mg/dL  ? BUN 8 6 - 20 mg/dL  ? Creatinine, Ser 0.38 (L) 0.44 - 1.00 mg/dL  ? Calcium 8.9 8.9 - 10.3 mg/dL  ? Total Protein 6.0 (L) 6.5 - 8.1 g/dL  ? Albumin 2.8 (L) 3.5 - 5.0 g/dL  ? AST 18 15 - 41 U/L  ? ALT 11 0 - 44 U/L  ? Alkaline Phosphatase 95 38 - 126 U/L  ? Total Bilirubin 0.3 0.3 - 1.2 mg/dL  ? GFR, Estimated >60 >60 mL/min  ? Anion gap 8 5 - 15  ? ?CT Angio Chest Pulmonary Embolism (PE) W or WO Contrast ? ?Result Date: 10/13/2021 ?CLINICAL DATA:  Twenty-six weeks pregnant with shortness of breath. Tachycardia. Evaluate for pulmonary embolism. EXAM: CT ANGIOGRAPHY CHEST WITH CONTRAST TECHNIQUE: Multidetector CT imaging of the chest was performed using the standard protocol during bolus administration of intravenous contrast. Multiplanar CT image reconstructions and MIPs were obtained to evaluate the vascular anatomy. RADIATION DOSE REDUCTION: This exam was performed according to the departmental dose-optimization program which includes automated exposure control, adjustment of the mA and/or kV according to patient size and/or use of iterative reconstruction technique. CONTRAST:  1mL OMNIPAQUE IOHEXOL 350 MG/ML SOLN COMPARISON:  Chest radiograph 11/11/2020. FINDINGS: Cardiovascular: The quality of this exam for evaluation of pulmonary embolism is poor/nondiagnostic. Contrast is centered in the SVC, with very poor  pulmonary artery opacification. No central or main pulmonary artery filling defect identified. Bovine arch. Normal aortic caliber. Normal heart size, without pericardial effusion. Mediastinum/Nodes: No

## 2021-10-13 NOTE — MAU Note (Signed)
Suzanne Shelton is a 31 y.o. at [redacted]w[redacted]d here in MAU reporting: been having really bad leg cramps since last THu/Fri.  Last night she was in tears.  Hard/painful to walk. This morning she got a really bad cramp in her rt calf, the pain is still there. No bleeding or leaking.  Reports +FM ?[facility] Dept.  ?Onset of complaint: 3/16 ?Pain score: 10 ?Vitals:  ? 10/13/21 1210  ?BP: 114/75  ?Pulse: (!) 114  ?Resp: 20  ?Temp: 98.3 ?F (36.8 ?C)  ?SpO2: 99%  ?   ?FHT:156 ?Lab or156ders placed from triage:  urine ?

## 2021-10-13 NOTE — MAU Note (Signed)
Transport arrive to take patient to CT. ?

## 2021-10-15 LAB — CULTURE, OB URINE
Culture: 20000 — AB
Special Requests: NORMAL

## 2023-03-22 ENCOUNTER — Emergency Department (HOSPITAL_COMMUNITY)
Admission: EM | Admit: 2023-03-22 | Discharge: 2023-03-22 | Disposition: A | Discharge status: Home / Self Care | Payer: Self-pay | Attending: Emergency Medicine | Admitting: Emergency Medicine

## 2023-03-22 ENCOUNTER — Encounter (HOSPITAL_COMMUNITY): Payer: Self-pay | Admitting: Emergency Medicine

## 2023-03-22 ENCOUNTER — Other Ambulatory Visit: Payer: Self-pay

## 2023-03-22 ENCOUNTER — Emergency Department (HOSPITAL_COMMUNITY): Payer: Self-pay

## 2023-03-22 DIAGNOSIS — W172XXA Fall into hole, initial encounter: Secondary | ICD-10-CM | POA: Insufficient documentation

## 2023-03-22 DIAGNOSIS — S82831A Other fracture of upper and lower end of right fibula, initial encounter for closed fracture: Secondary | ICD-10-CM | POA: Insufficient documentation

## 2023-03-22 DIAGNOSIS — Y9301 Activity, walking, marching and hiking: Secondary | ICD-10-CM | POA: Insufficient documentation

## 2023-03-22 MED ORDER — HYDROCODONE-ACETAMINOPHEN 5-325 MG PO TABS: 1.0000 | ORAL_TABLET | Freq: Four times a day (QID) | ORAL | 0 refills | Status: AC | PRN

## 2023-03-22 MED ORDER — IBUPROFEN 600 MG PO TABS: 600.0000 mg | ORAL_TABLET | Freq: Three times a day (TID) | ORAL | 0 refills | Status: AC

## 2023-03-22 NOTE — ED Triage Notes (Signed)
Pt reports mechanical fall last night and pain and swelling to right foot.

## 2023-03-22 NOTE — ED Provider Notes (Signed)
[place] EMERGENCY DEPARTMENT AT [facility] Provider Note   CSN: [id number] Arrival date & time: 03/22/23  7829     History  Chief Complaint  Patient presents with   Ankle Pain    Suzanne Shelton is a 32 y.o. female   The history is provided by the patient and the spouse.  Ankle Pain Location:  Ankle Time since incident:  1 day Injury: yes   Mechanism of injury comment:  Pt stepped in a hole in the grass while walking, inverting her right ankle Ankle location:  R ankle Pain details:    Quality:  Aching and shooting   Radiates to:  Does not radiate   Severity:  Moderate   Onset quality:  Sudden   Duration:  1 day   Timing:  Constant   Progression:  Unchanged Chronicity:  New Prior injury to area:  No Relieved by:  Nothing Ineffective treatments: she placed an otc pain patch with no improvement at the site. Associated symptoms: decreased ROM and swelling   Associated symptoms: no back pain and no muscle weakness        Home Medications Prior to Admission medications   Medication Sig Start Date End Date Taking? Authorizing Provider  HYDROcodone-acetaminophen (NORCO/VICODIN) 5-325 MG tablet Take 1 tablet by mouth every 6 (six) hours as needed for severe pain. 03/22/23  Yes Carylon Tamburro, Raynelle Fanning, PA-C  ibuprofen (ADVIL) 600 MG tablet Take 1 tablet (600 mg total) by mouth 3 (three) times daily. 03/22/23  Yes Sharel Behne, Raynelle Fanning, PA-C  cholecalciferol (VITAMIN D3) 25 MCG (1000 UNIT) tablet Take 1,000 Units by mouth daily.    [provider]  Prenatal Vit-Fe Fumarate-FA (PRENATAL MULTIVITAMIN) TABS tablet Take 1 tablet by mouth daily at 12 noon.    [provider]      Allergies    Patient has no known allergies.    Review of Systems   Review of Systems  Musculoskeletal:  Positive for arthralgias and joint swelling. Negative for back pain.  Skin:  Negative for wound.  Neurological:  Negative for weakness and numbness.  All other systems reviewed and  are negative.   Physical Exam Updated Vital Signs BP 109/83 (BP Location: Right Arm)   Pulse 81   Temp 98.9 F (37.2 C) (Oral)   Resp 16   Ht 5\' 4"  (1.626 m)   Wt 93.5 kg   SpO2 98%   BMI 35.38 kg/m  Physical Exam Vitals and nursing note reviewed.  Constitutional:      Appearance: She is well-developed.  HENT:     Head: Normocephalic.  Cardiovascular:     Rate and Rhythm: Normal rate.     Pulses: Normal pulses. No decreased pulses.          Dorsalis pedis pulses are 2+ on the right side and 2+ on the left side.       Posterior tibial pulses are 2+ on the right side and 2+ on the left side.  Musculoskeletal:        General: Swelling and tenderness present. No deformity.     Right ankle: Swelling and ecchymosis present. Tenderness present over the lateral malleolus. No proximal fibula tenderness. Decreased range of motion. Normal pulse.     Right Achilles Tendon: Normal.  Skin:    General: Skin is warm and dry.     Findings: No lesion.  Neurological:     Mental Status: She is alert.     Sensory: No sensory deficit.  ED Results / Procedures / Treatments   Labs (all labs ordered are listed, but only abnormal results are displayed) Labs Reviewed - No data to display  EKG None  Radiology DG Ankle Complete Right  Result Date: 03/22/2023 CLINICAL DATA:  Fall.  Pain and swelling. EXAM: RIGHT ANKLE - COMPLETE 3+ VIEW COMPARISON:  None Available. FINDINGS: There is an acute transverse fracture of the distal fibular metaphysis with up to 2 mm lateral displacement of the distal fracture component with respect to the proximal fracture component. Approximately 3 mm diastasis at the lateral aspect of the fracture. Moderate lateral malleolar soft tissue swelling. The ankle mortise remains symmetric and intact. Joint spaces are preserved.  No dislocation. IMPRESSION: Acute transverse fracture of the distal fibular metaphysis with up to 2 mm lateral displacement of the distal  fracture component with respect to the proximal fracture component. Electronically Signed   By: Neita Garnet M.D.   On: 03/22/2023 12:03    Procedures Procedures    Medications Ordered in ED Medications - No data to display  ED Course/ Medical Decision Making/ A&P                                 Medical Decision Making Pt with right ankle pain and swelling s/p inversion injury. Ddx including fracture, sprain/strain, dislocation.  Imaging reveals distal fibular fracture.  She has no proximal pain. Placed in cam walker, discussed ice, elevation, meds for sx relief and close f/u with Dr. Romeo Apple, referral given.   Offered crutches for prn use - pt has already.  Amount and/or Complexity of Data Reviewed Radiology: ordered and independent interpretation performed.    Details: Per above, reviewed films, agree with interpretation.   Risk Prescription drug management.           Final Clinical Impression(s) / ED Diagnoses Final diagnoses:  Other closed fracture of distal end of right fibula, initial encounter    Rx / DC Orders ED Discharge Orders          Ordered    ibuprofen (ADVIL) 600 MG tablet  3 times daily        03/22/23 1239    HYDROcodone-acetaminophen (NORCO/VICODIN) 5-325 MG tablet  Every 6 hours PRN        03/22/23 1239              Burgess Amor, PA-C 03/22/23 1435    Sloan Leiter, DO 03/24/23 2009

## 2023-03-22 NOTE — ED Notes (Signed)
X-ray at bedside

## 2023-03-22 NOTE — ED Notes (Signed)
Pt given ice pack for ankle swelling, pt politely refused to use ice at this time. Warm blanket also given.

## 2023-03-22 NOTE — Discharge Instructions (Signed)
Wear the boot provided to protect your ankle injury at all times except when in the shower, use caution when you come out of the boot for showering.  Continued ice and elevation will help with pain and swelling.  I also recommend ibuprofen which will help with pain and swelling as well which has been prescribed.  I have also prescribed a small quantity of narcotic pain reliever if needed for extra pain relief, you may find this helpful especially at nighttime.

## 2023-03-24 ENCOUNTER — Ambulatory Visit (INDEPENDENT_AMBULATORY_CARE_PROVIDER_SITE_OTHER): Payer: Self-pay | Admitting: Orthopedic Surgery

## 2023-03-24 ENCOUNTER — Encounter: Payer: Self-pay | Admitting: Orthopedic Surgery

## 2023-03-24 VITALS — BP 121/79 | HR 93 | Ht 64.0 in | Wt 206.0 lb

## 2023-03-24 DIAGNOSIS — S82831A Other fracture of upper and lower end of right fibula, initial encounter for closed fracture: Secondary | ICD-10-CM

## 2023-03-24 NOTE — Progress Notes (Signed)
Office Visit Note   Patient: Suzanne Shelton           Date of Birth: 30-Oct-1990           MRN: [id number] Visit Date: 03/24/2023 Requested by: Randell Patient [facility] Dept Personal [address] [place],  [place] [address] PCP: Health, [facility] Dept Personal   Assessment & Plan:   Encounter Diagnosis  Name Primary?   Other closed fracture of distal end of right fibula, initial encounter Yes    No orders of the defined types were placed in this encounter.   Right ankle fracture Weber a distal fibula  Plan weight-bear as tolerated with crutches as needed, continue Price routine with CAM Walker follow-up 4 weeks x-ray   Subjective: Chief Complaint  Patient presents with   Ankle Injury    Right 03/21/23    HPI: This is a 32 year old female who unfortunately fell and injured her right ankle on March 21, 2023 she sustained a transverse fracture of the distal fibula.  She was seen in the emergency room placed in a cam walker and started on crutches  She comes in complaint of lateral ankle pain              ROS: No numbness tingling or skin issues   Images personally read and my interpretation : Outside images were reviewed and interpreted as transverse fibula fracture distal fibula consistent with Weber a type fracture ankle mortise intact  Visit Diagnoses:  1. Other closed fracture of distal end of right fibula, initial encounter      Follow-Up Instructions: Return in about 4 weeks (around 04/21/2023) for Right ankle fracture care/ xray .    Objective: Vital Signs: BP 121/79   Pulse 93   Ht 5\' 4"  (1.626 m)   Wt 206 lb (93.4 kg)   BMI 35.36 kg/m   Physical Exam Vitals and nursing note reviewed.  Constitutional:      Appearance: Normal appearance.  HENT:     Head: Normocephalic and atraumatic.  Eyes:     General: No scleral icterus.       Right eye: No discharge.        Left eye: No discharge.     Extraocular Movements:  Extraocular movements intact.     Conjunctiva/sclera: Conjunctivae normal.     Pupils: Pupils are equal, round, and reactive to light.  Cardiovascular:     Rate and Rhythm: Normal rate.     Pulses: Normal pulses.  Skin:    General: Skin is warm and dry.     Capillary Refill: Capillary refill takes less than 2 seconds.  Neurological:     General: No focal deficit present.     Mental Status: She is alert and oriented to person, place, and time.  Psychiatric:        Mood and Affect: Mood normal.        Behavior: Behavior normal.        Thought Content: Thought content normal.        Judgment: Judgment normal.      Right Ankle Exam   Tenderness  The patient is experiencing tenderness in the lateral malleolus. Swelling: mild  Range of Motion  Dorsiflexion:  abnormal  Plantar flexion:  abnormal  Eversion:  abnormal  Inversion:  abnormal   Muscle Strength  Right ankle normal muscle strength: Not tested.        Specialty Comments:  No specialty comments available.  Imaging: No  results found.   PMFS History: There are no problems to display for this patient.  History reviewed. No pertinent past medical history.  Family History  Problem Relation Age of Onset   Diabetes Mother    Hyperlipidemia Mother    Ulcerative colitis Father     Past Surgical History:  Procedure Laterality Date   ABSCESS DRAINAGE     sacrum    Social History   Occupational History   Not on file  Tobacco Use   Smoking status: Never   Smokeless tobacco: Never  Substance and Sexual Activity   Alcohol use: No   Drug use: No   Sexual activity: Yes    Birth control/protection: None

## 2023-04-20 DIAGNOSIS — S82831D Other fracture of upper and lower end of right fibula, subsequent encounter for closed fracture with routine healing: Secondary | ICD-10-CM | POA: Insufficient documentation

## 2023-04-22 ENCOUNTER — Ambulatory Visit (INDEPENDENT_AMBULATORY_CARE_PROVIDER_SITE_OTHER): Payer: Self-pay | Admitting: Orthopedic Surgery

## 2023-04-22 ENCOUNTER — Other Ambulatory Visit (INDEPENDENT_AMBULATORY_CARE_PROVIDER_SITE_OTHER): Payer: Self-pay

## 2023-04-22 DIAGNOSIS — S82831D Other fracture of upper and lower end of right fibula, subsequent encounter for closed fracture with routine healing: Secondary | ICD-10-CM

## 2023-04-22 NOTE — Progress Notes (Signed)
Follow-up fracture care Weber a fracture right fibula  Chief Complaint  Patient presents with   Fracture    R ankle DOI 03/21/23    Patient was in a cam walker took it off Monday she did or was able to walk  She is having some swelling and soreness along the lateral ankle  She is headed to [place] for a trip  X-rays show no change in position of the fracture ankle mortise is intact  Distal fibular fragment tilted slightly  Her foot looks good she has some swelling laterally she has minor tenderness to palpation there.  Recommend ankle sleeve.  Bring the boot with her to the trip and see me in 4 weeks for repeat x-ray continue ibuprofen as needed

## 2023-05-20 ENCOUNTER — Ambulatory Visit (INDEPENDENT_AMBULATORY_CARE_PROVIDER_SITE_OTHER): Payer: Self-pay | Admitting: Orthopedic Surgery

## 2023-05-20 ENCOUNTER — Other Ambulatory Visit (INDEPENDENT_AMBULATORY_CARE_PROVIDER_SITE_OTHER): Payer: Self-pay

## 2023-05-20 DIAGNOSIS — S82831D Other fracture of upper and lower end of right fibula, subsequent encounter for closed fracture with routine healing: Secondary | ICD-10-CM

## 2023-05-20 NOTE — Progress Notes (Signed)
   VISIT TYPE: FOLLOW UP   Chief Complaint  Patient presents with   Fracture    R ankle DOI 03/21/23    Encounter Diagnosis  Name Primary?   Other closed fracture of distal end of right fibula with routine healing, subsequent encounter 03/22/23 Yes    Assessment and Plan: Doing well I released her today she just has a little soreness and slight swelling with long-term walking her x-ray looks good it will finish healing the last 5 or 10% no need for further imaging  HPI: Weber a fracture lateral malleolus right leg treated with immobilization doing well has some soreness occasionally with long-term standing walking   LMP  (LMP Unknown)   Ortho Exam  Mild tenderness at the fracture site just a little bit definitely getting better full range of motion no instability Imaging see x-ray report internal imaging shows fracture healing appropriately with maybe somewhere between 5 and 15% healing needed for complete healing on x-ray  A/P Encounter Diagnosis  Name Primary?   Other closed fracture of distal end of right fibula with routine healing, subsequent encounter 03/22/23 Yes    No orders of the defined types were placed in this encounter.

## 2024-02-25 ENCOUNTER — Encounter: Payer: Self-pay | Admitting: Emergency Medicine

## 2024-02-25 ENCOUNTER — Ambulatory Visit
Admission: EM | Admit: 2024-02-25 | Discharge: 2024-02-25 | Disposition: A | Discharge status: Home / Self Care | Payer: Self-pay | Attending: Nurse Practitioner | Admitting: Nurse Practitioner

## 2024-02-25 ENCOUNTER — Other Ambulatory Visit: Payer: Self-pay

## 2024-02-25 DIAGNOSIS — J309 Allergic rhinitis, unspecified: Secondary | ICD-10-CM

## 2024-02-25 DIAGNOSIS — R062 Wheezing: Secondary | ICD-10-CM

## 2024-02-25 DIAGNOSIS — R059 Cough, unspecified: Secondary | ICD-10-CM

## 2024-02-25 LAB — POC SOFIA SARS ANTIGEN FIA: SARS Coronavirus 2 Ag: NEGATIVE

## 2024-02-25 MED ORDER — MONTELUKAST SODIUM 10 MG PO TABS: 10.0000 mg | ORAL_TABLET | Freq: Every day | ORAL | 0 refills | Status: AC

## 2024-02-25 MED ORDER — IPRATROPIUM-ALBUTEROL 0.5-2.5 (3) MG/3ML IN SOLN
3.0000 mL | Freq: Once | RESPIRATORY_TRACT | Status: AC
  Administered 2024-02-25: 3 mL via RESPIRATORY_TRACT

## 2024-02-25 MED ORDER — ALBUTEROL SULFATE HFA 108 (90 BASE) MCG/ACT IN AERS: 2.0000 | INHALATION_SPRAY | Freq: Four times a day (QID) | RESPIRATORY_TRACT | 0 refills | Status: AC | PRN

## 2024-02-25 MED ORDER — IPRATROPIUM-ALBUTEROL 0.5-2.5 (3) MG/3ML IN SOLN: 3.0000 mL | Freq: Once | RESPIRATORY_TRACT | Status: DC

## 2024-02-25 MED ORDER — METHYLPREDNISOLONE SODIUM SUCC 125 MG IJ SOLR
125.0000 mg | Freq: Once | INTRAMUSCULAR | Status: AC
  Administered 2024-02-25: 125 mg via INTRAMUSCULAR

## 2024-02-25 MED ORDER — PSEUDOEPH-BROMPHEN-DM 30-2-10 MG/5ML PO SYRP: 5.0000 mL | ORAL_SOLUTION | Freq: Four times a day (QID) | ORAL | 0 refills | Status: AC | PRN

## 2024-02-25 MED ORDER — PREDNISONE 20 MG PO TABS: 40.0000 mg | ORAL_TABLET | Freq: Every day | ORAL | 0 refills | Status: AC

## 2024-02-25 NOTE — ED Triage Notes (Signed)
 Pt reports cough, sneezing, intermittent wheezing intermittently since last week but reports was cleaning at home and reports flared it all back up again.   Has tried benadryl .

## 2024-02-25 NOTE — Discharge Instructions (Addendum)
 You were given an injection of Solu-Medrol 125 mg along with a nebulizer treatment.  Start the prednisone  on 02/26/2024. Your COVID test was negative. Take medication as prescribed. Increase fluids and allow for plenty of rest. You may take over-the-counter Tylenol  or ibuprofen  as needed for pain, fever, or general discomfort. Recommend use of a humidifier in your bedroom at nighttime during sleep and sleeping elevated on pillows while cough symptoms persist. Avoid allergy triggers. As discussed, it is recommended that you follow-up with your primary care physician for further evaluation and workup for asthma. Go to the emergency department if you experience shortness of breath, difficulty breathing, or become unable to speak in a complete sentence. Follow-up as needed.

## 2024-02-25 NOTE — ED Provider Notes (Signed)
 [facility]    CSN: [id number] Arrival date & time: 02/25/24  0803      History   Chief Complaint Chief Complaint  Patient presents with   Cough    HPI Suzanne Shelton is a 33 y.o. female.   The history is provided by the patient.   Patient presents with a 1 week history of wheezing, sneezing nasal congestion, and cough.  Patient reports history of asthma symptoms, but asthma has not been officially diagnosed.  She denies fever, chills, headache, ear pain, chest pain, abdominal pain, nausea, vomiting, diarrhea, or rash.  Patient states that she took Benadryl  this morning for symptoms.  She denies any obvious known sick contacts.  History reviewed. No pertinent past medical history.  Patient Active Problem List   Diagnosis Date Noted   Closed fracture of distal end of right fibula with routine healing 04/20/2023    Past Surgical History:  Procedure Laterality Date   ABSCESS DRAINAGE     sacrum     OB History     Gravida  4   Para  3   Term  3   Preterm      AB      Living  3      SAB      IAB      Ectopic      Multiple  0   Live Births  3            Home Medications    Prior to Admission medications   Medication Sig Start Date End Date Taking? Authorizing Provider  albuterol  (VENTOLIN  HFA) 108 (90 Base) MCG/ACT inhaler Inhale 2 puffs into the lungs every 6 (six) hours as needed. 02/25/24  Yes Leath-Warren, Etta PARAS, NP  brompheniramine-pseudoephedrine-DM 30-2-10 MG/5ML syrup Take 5 mLs by mouth 4 (four) times daily as needed. 02/25/24  Yes Leath-Warren, Etta PARAS, NP  cholecalciferol (VITAMIN D3) 25 MCG (1000 UNIT) tablet Take 1,000 Units by mouth daily. Patient not taking: Reported on 05/20/2023    [provider]  HYDROcodone -acetaminophen  (NORCO/VICODIN) 5-325 MG tablet Take 1 tablet by mouth every 6 (six) hours as needed for severe pain. Patient not taking: Reported on 04/22/2023 03/22/23   Idol, Julie, PA-C   ibuprofen  (ADVIL ) 600 MG tablet Take 1 tablet (600 mg total) by mouth 3 (three) times daily. Patient not taking: Reported on 05/20/2023 03/22/23   Idol, Julie, PA-C  montelukast (SINGULAIR) 10 MG tablet Take 1 tablet (10 mg total) by mouth at bedtime. 02/25/24  Yes Leath-Warren, Etta PARAS, NP  predniSONE  (DELTASONE ) 20 MG tablet Take 2 tablets (40 mg total) by mouth daily with breakfast for 5 days. 02/25/24 03/01/24 Yes Leath-Warren, Etta PARAS, NP  Prenatal Vit-Fe Fumarate-FA (PRENATAL MULTIVITAMIN) TABS tablet Take 1 tablet by mouth daily at 12 noon. Patient not taking: Reported on 05/20/2023    [provider]    Family History Family History  Problem Relation Age of Onset   Diabetes Mother    Hyperlipidemia Mother    Ulcerative colitis Father     Social History Social History   Tobacco Use   Smoking status: Never   Smokeless tobacco: Never  Substance Use Topics   Alcohol use: No   Drug use: No     Allergies   Patient has no known allergies.   Review of Systems Review of Systems Per HPI  Physical Exam Triage Vital Signs ED Triage Vitals  Encounter Vitals Group     BP 02/25/24  9171 118/87     Girls Systolic BP Percentile --      Girls Diastolic BP Percentile --      Boys Systolic BP Percentile --      Boys Diastolic BP Percentile --      Pulse Rate 02/25/24 0828 84     Resp 02/25/24 0828 20     Temp 02/25/24 0828 98.3 F (36.8 C)     Temp Source 02/25/24 0828 Oral     SpO2 02/25/24 0828 96 %     Weight --      Height --      Head Circumference --      Peak Flow --      Pain Score 02/25/24 0827 0     Pain Loc --      Pain Education --      Exclude from Growth Chart --    No data found.  Updated Vital Signs BP 118/87 (BP Location: Right Arm)   Pulse 84   Temp 98.3 F (36.8 C) (Oral)   Resp 20   SpO2 96%   Breastfeeding No   Visual Acuity Right Eye Distance:   Left Eye Distance:   Bilateral Distance:    Right Eye Near:   Left Eye Near:     Bilateral Near:     Physical Exam Vitals and nursing note reviewed.  Constitutional:      General: She is not in acute distress.    Appearance: Normal appearance.  HENT:     Head: Normocephalic.     Right Ear: Tympanic membrane, ear canal and external ear normal.     Left Ear: Tympanic membrane, ear canal and external ear normal.     Nose: Congestion present.     Mouth/Throat:     Mouth: Mucous membranes are moist.     Pharynx: No posterior oropharyngeal erythema.  Eyes:     Extraocular Movements: Extraocular movements intact.     Pupils: Pupils are equal, round, and reactive to light.  Cardiovascular:     Rate and Rhythm: Normal rate and regular rhythm.     Pulses: Normal pulses.     Heart sounds: Normal heart sounds.  Pulmonary:     Effort: Pulmonary effort is normal.     Breath sounds: Decreased air movement present. Examination of the right-upper field reveals wheezing. Examination of the left-upper field reveals wheezing. Examination of the right-lower field reveals wheezing. Examination of the left-lower field reveals wheezing. Wheezing present.  Abdominal:     General: Bowel sounds are normal.     Palpations: Abdomen is soft.     Tenderness: There is no abdominal tenderness.  Musculoskeletal:     Cervical back: Normal range of motion.  Skin:    General: Skin is warm and dry.  Neurological:     General: No focal deficit present.     Mental Status: She is alert and oriented to person, place, and time.  Psychiatric:        Mood and Affect: Mood normal.        Behavior: Behavior normal.      UC Treatments / Results  Labs (all labs ordered are listed, but only abnormal results are displayed) Labs Reviewed  POC SOFIA SARS ANTIGEN FIA    EKG   Radiology No results found.  Procedures Procedures (including critical care time)  Medications Ordered in UC Medications  methylPREDNISolone sodium succinate (SOLU-MEDROL) 125 mg/2 mL injection 125 mg (125 mg  Intramuscular Given 02/25/24  9152)  ipratropium-albuterol  (DUONEB) 0.5-2.5 (3) MG/3ML nebulizer solution 3 mL (3 mLs Nebulization Given 02/25/24 0847)    Initial Impression / Assessment and Plan / UC Course  I have reviewed the triage vital signs and the nursing notes.  Pertinent labs & imaging results that were available during my care of the patient were reviewed by me and considered in my medical decision making (see chart for details).  On initial exam, room air sats at 96%.  DuoNeb was administered with good improvement of symptoms.  Discussion with patient that she should follow-up with PCP for further evaluation and workup of possible asthma.  Solu-Medrol 125 mg IM also administered.  Will start patient on prednisone  40 mg for the next 5 days, Singulair 10 mg for allergy and asthma symptoms, Bromfed-DM for the cough, and an albuterol  inhaler as needed for wheezing.  Supportive care recommendations were provided discussed with the patient to include fluids, rest, over-the-counter analgesics, and use of a humidifier during sleep.  Discussed indications with patient regarding ER follow-up.  Patient was in agreement with this plan of care and verbalizes understanding.  All questions were answered.  Patient stable for discharge.  Final Clinical Impressions(s) / UC Diagnoses   Final diagnoses:  Cough, unspecified type  Wheezing  Allergic rhinitis, unspecified seasonality, unspecified trigger     Discharge Instructions      You were given an injection of Solu-Medrol 125 mg along with a nebulizer treatment.  Start the prednisone  on 02/26/2024. Your COVID test was negative. Take medication as prescribed. Increase fluids and allow for plenty of rest. You may take over-the-counter Tylenol  or ibuprofen  as needed for pain, fever, or general discomfort. Recommend use of a humidifier in your bedroom at nighttime during sleep and sleeping elevated on pillows while cough symptoms persist. Avoid allergy  triggers. As discussed, it is recommended that you follow-up with your primary care physician for further evaluation and workup for asthma. Go to the emergency department if you experience shortness of breath, difficulty breathing, or become unable to speak in a complete sentence. Follow-up as needed.     ED Prescriptions     Medication Sig Dispense Auth. Provider   albuterol  (VENTOLIN  HFA) 108 (90 Base) MCG/ACT inhaler Inhale 2 puffs into the lungs every 6 (six) hours as needed. 8 g Leath-Warren, Etta PARAS, NP   montelukast (SINGULAIR) 10 MG tablet Take 1 tablet (10 mg total) by mouth at bedtime. 30 tablet Leath-Warren, Etta PARAS, NP   predniSONE  (DELTASONE ) 20 MG tablet Take 2 tablets (40 mg total) by mouth daily with breakfast for 5 days. 10 tablet Leath-Warren, Etta PARAS, NP   brompheniramine-pseudoephedrine-DM 30-2-10 MG/5ML syrup Take 5 mLs by mouth 4 (four) times daily as needed. 140 mL Leath-Warren, Etta PARAS, NP      PDMP not reviewed this encounter.   Gilmer Etta PARAS, NP 02/25/24 [phone]
